# Patient Record
Sex: Female | Born: 1990 | Race: Black or African American | Hispanic: No | Marital: Single | Smoking: Former smoker
Health system: Southern US, Community
[De-identification: ages and names within clinical notes are randomized; demographics above are authoritative.]

## PROBLEM LIST (undated history)

## (undated) DIAGNOSIS — J45909 Unspecified asthma, uncomplicated: Secondary | ICD-10-CM

---

## 2018-08-21 ENCOUNTER — Other Ambulatory Visit: Payer: Self-pay

## 2018-08-21 ENCOUNTER — Emergency Department (HOSPITAL_BASED_OUTPATIENT_CLINIC_OR_DEPARTMENT_OTHER)
Admission: EM | Admit: 2018-08-21 | Discharge: 2018-08-21 | Disposition: A | Discharge status: Home / Self Care | Payer: Medicaid Other | Attending: Emergency Medicine | Admitting: Emergency Medicine

## 2018-08-21 ENCOUNTER — Encounter (HOSPITAL_BASED_OUTPATIENT_CLINIC_OR_DEPARTMENT_OTHER): Payer: Self-pay | Admitting: *Deleted

## 2018-08-21 ENCOUNTER — Emergency Department (HOSPITAL_BASED_OUTPATIENT_CLINIC_OR_DEPARTMENT_OTHER): Payer: Medicaid Other

## 2018-08-21 DIAGNOSIS — O9989 Other specified diseases and conditions complicating pregnancy, childbirth and the puerperium: Secondary | ICD-10-CM | POA: Diagnosis present

## 2018-08-21 DIAGNOSIS — R1084 Generalized abdominal pain: Secondary | ICD-10-CM | POA: Diagnosis not present

## 2018-08-21 DIAGNOSIS — O283 Abnormal ultrasonic finding on antenatal screening of mother: Secondary | ICD-10-CM | POA: Diagnosis not present

## 2018-08-21 DIAGNOSIS — Z3201 Encounter for pregnancy test, result positive: Secondary | ICD-10-CM | POA: Diagnosis not present

## 2018-08-21 DIAGNOSIS — Z79899 Other long term (current) drug therapy: Secondary | ICD-10-CM | POA: Insufficient documentation

## 2018-08-21 DIAGNOSIS — Z87891 Personal history of nicotine dependence: Secondary | ICD-10-CM | POA: Insufficient documentation

## 2018-08-21 DIAGNOSIS — M545 Low back pain: Secondary | ICD-10-CM | POA: Diagnosis not present

## 2018-08-21 DIAGNOSIS — Z3A00 Weeks of gestation of pregnancy not specified: Secondary | ICD-10-CM | POA: Diagnosis not present

## 2018-08-21 DIAGNOSIS — O26899 Other specified pregnancy related conditions, unspecified trimester: Secondary | ICD-10-CM | POA: Insufficient documentation

## 2018-08-21 DIAGNOSIS — O3680X0 Pregnancy with inconclusive fetal viability, not applicable or unspecified: Secondary | ICD-10-CM

## 2018-08-21 DIAGNOSIS — O23599 Infection of other part of genital tract in pregnancy, unspecified trimester: Secondary | ICD-10-CM | POA: Diagnosis not present

## 2018-08-21 DIAGNOSIS — R110 Nausea: Secondary | ICD-10-CM | POA: Insufficient documentation

## 2018-08-21 DIAGNOSIS — R109 Unspecified abdominal pain: Secondary | ICD-10-CM

## 2018-08-21 LAB — CBC WITH DIFFERENTIAL/PLATELET
Basophils Absolute: 0 10*3/uL (ref 0.0–0.1)
Basophils Relative: 0 %
Eosinophils Absolute: 0.3 10*3/uL (ref 0.0–0.7)
Eosinophils Relative: 3 %
HCT: 34.6 % — ABNORMAL LOW (ref 36.0–46.0)
Hemoglobin: 11.2 g/dL — ABNORMAL LOW (ref 12.0–15.0)
Lymphocytes Relative: 19 %
Lymphs Abs: 2.2 10*3/uL (ref 0.7–4.0)
MCH: 26.2 pg (ref 26.0–34.0)
MCHC: 32.4 g/dL (ref 30.0–36.0)
MCV: 80.8 fL (ref 78.0–100.0)
Monocytes Absolute: 0.6 10*3/uL (ref 0.1–1.0)
Monocytes Relative: 5 %
Neutro Abs: 8.7 10*3/uL — ABNORMAL HIGH (ref 1.7–7.7)
Neutrophils Relative %: 73 %
Platelets: 271 10*3/uL (ref 150–400)
RBC: 4.28 MIL/uL (ref 3.87–5.11)
RDW: 16.7 % — ABNORMAL HIGH (ref 11.5–15.5)
WBC: 11.9 10*3/uL — ABNORMAL HIGH (ref 4.0–10.5)

## 2018-08-21 LAB — WET PREP, GENITAL
Clue Cells Wet Prep HPF POC: NONE SEEN
Sperm: NONE SEEN
Trich, Wet Prep: NONE SEEN
Yeast Wet Prep HPF POC: NONE SEEN

## 2018-08-21 LAB — COMPREHENSIVE METABOLIC PANEL
ALT: 14 U/L (ref 0–44)
AST: 16 U/L (ref 15–41)
Albumin: 3 g/dL — ABNORMAL LOW (ref 3.5–5.0)
Alkaline Phosphatase: 67 U/L (ref 38–126)
Anion gap: 9 (ref 5–15)
BUN: 9 mg/dL (ref 6–20)
CO2: 25 mmol/L (ref 22–32)
Calcium: 9 mg/dL (ref 8.9–10.3)
Chloride: 108 mmol/L (ref 98–111)
Creatinine, Ser: 0.71 mg/dL (ref 0.44–1.00)
GFR calc Af Amer: >60 mL/min (ref >60)
GFR calc non Af Amer: >60 mL/min (ref >60)
Glucose, Bld: 105 mg/dL — ABNORMAL HIGH (ref 70–99)
Potassium: 4.1 mmol/L (ref 3.5–5.1)
Sodium: 142 mmol/L (ref 135–145)
Total Bilirubin: 0.4 mg/dL (ref 0.3–1.2)
Total Protein: 7.8 g/dL (ref 6.5–8.1)

## 2018-08-21 LAB — URINALYSIS, ROUTINE W REFLEX MICROSCOPIC
BILIRUBIN URINE: NEGATIVE
GLUCOSE, UA: NEGATIVE mg/dL
HGB URINE DIPSTICK: NEGATIVE
KETONES UR: NEGATIVE mg/dL
LEUKOCYTES UA: NEGATIVE
Nitrite: NEGATIVE
PH: 5.5 (ref 5.0–8.0)
PROTEIN: NEGATIVE mg/dL
Specific Gravity, Urine: >1.03 — ABNORMAL HIGH (ref 1.005–1.030)

## 2018-08-21 LAB — PREGNANCY, URINE: Preg Test, Ur: POSITIVE — AB

## 2018-08-21 LAB — HCG, QUANTITATIVE, PREGNANCY: hCG, Beta Chain, Quant, S: 362 m[IU]/mL — ABNORMAL HIGH (ref <5)

## 2018-08-21 MED ORDER — AZITHROMYCIN 250 MG PO TABS
1000.0000 mg | ORAL_TABLET | Freq: Once | ORAL | Status: AC
  Administered 2018-08-21: 1000 mg via ORAL
  Filled 2018-08-21: qty 4

## 2018-08-21 MED ORDER — CEFTRIAXONE SODIUM 250 MG IJ SOLR
250.0000 mg | Freq: Once | INTRAMUSCULAR | Status: AC
  Administered 2018-08-21: 250 mg via INTRAMUSCULAR
  Filled 2018-08-21: qty 250

## 2018-08-21 MED ORDER — PRENATAL COMPLETE 14-0.4 MG PO TABS: 1.0000 | ORAL_TABLET | Freq: Every day | ORAL | 0 refills | Status: DC

## 2018-08-21 NOTE — ED Provider Notes (Signed)
[facility] EMERGENCY DEPARTMENT Provider Note   CSN: [id number] Arrival date & time: 08/21/18  1027     History   Chief Complaint Chief Complaint  Patient presents with  . Abdominal Cramping    HPI Katherine Padilla is a 27 y.o. female with a one-week history of intermittent lower abdominal cramping that feels like a menstrual cramp.  Patient reports she has had some vaginal discharge, but no vaginal bleeding.  She took a pregnancy test at home 2 days ago that was positive.  She reports her last menstrual cycle was sometime in July.  She has had some associated nausea, but no vomiting.  She denies any other abdominal pain, chest pain, shortness of breath, urinary symptoms.  She has had occasional low back pain in association with her cramping.  No medications taken prior to arrival.  She has not seen OB/GYN yet.  HPI  History reviewed. No pertinent past medical history.  There are no active problems to display for this patient.   History reviewed. No pertinent surgical history.   OB History   None      Home Medications    Prior to Admission medications   Medication Sig Start Date End Date Taking? Authorizing Provider  Prenatal Vit-Fe Fumarate-FA (PRENATAL COMPLETE) 14-0.4 MG TABS Take 1 tablet by mouth daily. 08/21/18   Emi Holes, PA-C    Family History History reviewed. No pertinent family history.  Social History Social History   Tobacco Use  . Smoking status: Former Games developer  . Smokeless tobacco: Never Used  Substance Use Topics  . Alcohol use: Not Currently  . Drug use: Not Currently     Allergies   Patient has no known allergies.   Review of Systems Review of Systems  Constitutional: Negative for chills and fever.  HENT: Negative for facial swelling and sore throat.   Respiratory: Negative for shortness of breath.   Cardiovascular: Negative for chest pain.  Gastrointestinal: Positive for abdominal pain (lower abdominal cramping) and  nausea. Negative for vomiting.  Genitourinary: Positive for pelvic pain and vaginal discharge. Negative for dysuria and vaginal bleeding.  Musculoskeletal: Positive for back pain.  Skin: Negative for rash and wound.  Neurological: Negative for headaches.  Psychiatric/Behavioral: The patient is not nervous/anxious.      Physical Exam Updated Vital Signs BP 132/63 (BP Location: Left Arm)   Pulse 96   Temp 98.5 F (36.9 C) (Oral)   Resp 16   Ht 5\' 1"  (1.549 m)   Wt 116.1 kg   LMP 06/21/2018   SpO2 100%   BMI 48.37 kg/m   Physical Exam  Constitutional: She appears well-developed and well-nourished. No distress.  HENT:  Head: Normocephalic and atraumatic.  Mouth/Throat: Oropharynx is clear and moist. No oropharyngeal exudate.  Eyes: Pupils are equal, round, and reactive to light. Conjunctivae are normal. Right eye exhibits no discharge. Left eye exhibits no discharge. No scleral icterus.  Neck: Normal range of motion. Neck supple. No thyromegaly present.  Cardiovascular: Normal rate, regular rhythm, normal heart sounds and intact distal pulses. Exam reveals no gallop and no friction rub.  No murmur heard. Pulmonary/Chest: Effort normal and breath sounds normal. No stridor. No respiratory distress. She has no wheezes. She has no rales.  Abdominal: Soft. Bowel sounds are normal. She exhibits no distension. There is no tenderness. There is no rebound and no guarding.  Genitourinary: Uterus normal. Pelvic exam was performed with patient supine. Cervix exhibits no motion tenderness. Right adnexum displays no  tenderness. Left adnexum displays no tenderness. Vaginal discharge (white. milky) found.  Genitourinary Comments: Chaperone present  Musculoskeletal: She exhibits no edema.  Lymphadenopathy:    She has no cervical adenopathy.  Neurological: She is alert. Coordination normal.  Skin: Skin is warm and dry. No rash noted. She is not diaphoretic. No pallor.  Psychiatric: She has a  normal mood and affect.  Nursing note and vitals reviewed.    ED Treatments / Results  Labs (all labs ordered are listed, but only abnormal results are displayed) Labs Reviewed  WET PREP, GENITAL - Abnormal; Notable for the following components:      Result Value   WBC, Wet Prep HPF POC MODERATE (*)    All other components within normal limits  URINALYSIS, ROUTINE W REFLEX MICROSCOPIC - Abnormal; Notable for the following components:   Specific Gravity, Urine >1.030 (*)    All other components within normal limits  PREGNANCY, URINE - Abnormal; Notable for the following components:   Preg Test, Ur POSITIVE (*)    All other components within normal limits  COMPREHENSIVE METABOLIC PANEL - Abnormal; Notable for the following components:   Glucose, Bld 105 (*)    Albumin 3.0 (*)    All other components within normal limits  CBC WITH DIFFERENTIAL/PLATELET - Abnormal; Notable for the following components:   WBC 11.9 (*)    Hemoglobin 11.2 (*)    HCT 34.6 (*)    RDW 16.7 (*)    Neutro Abs 8.7 (*)    All other components within normal limits  HCG, QUANTITATIVE, PREGNANCY - Abnormal; Notable for the following components:   hCG, Beta Chain, Quant, S 362 (*)    All other components within normal limits  GC/CHLAMYDIA PROBE AMP ([place]) NOT AT [facility]    EKG None  Radiology US Ob Comp < 14 Wks  Result Date: 08/21/2018 CLINICAL DATA:  Pelvic cramping for the past 2 days. Positive pregnancy test. Patient unsure of LMP. EXAM: OBSTETRIC <14 WK [place] AND TRANSVAGINAL OB US TECHNIQUE: Both transabdominal and transvaginal ultrasound examinations were performed for complete evaluation of the gestation as well as the maternal uterus, adnexal regions, and pelvic cul-de-sac. Transvaginal technique was performed to assess early pregnancy. COMPARISON:  None. FINDINGS: Intrauterine gestational sac: None Yolk sac:  Not Visualized. Embryo:  Not Visualized. Maternal uterus/adnexae: Unremarkable.  Right  corpus luteum. Trace free fluid in the pelvis, likely physiologic. IMPRESSION: No IUP is visualized. By definition, in the setting of a positive pregnancy test, this reflects a pregnancy of unknown location. Differential considerations include early normal IUP, abnormal IUP/missed abortion, or nonvisualized ectopic pregnancy. Serial beta HCG is suggested. Consider repeat pelvic ultrasound in 14 days. Electronically Signed   By: Obie Dredge M.D.   On: 08/21/2018 14:06   US Ob Transvaginal  Result Date: 08/21/2018 CLINICAL DATA:  Pelvic cramping for the past 2 days. Positive pregnancy test. Patient unsure of LMP. EXAM: OBSTETRIC <14 WK [place] AND TRANSVAGINAL OB US TECHNIQUE: Both transabdominal and transvaginal ultrasound examinations were performed for complete evaluation of the gestation as well as the maternal uterus, adnexal regions, and pelvic cul-de-sac. Transvaginal technique was performed to assess early pregnancy. COMPARISON:  None. FINDINGS: Intrauterine gestational sac: None Yolk sac:  Not Visualized. Embryo:  Not Visualized. Maternal uterus/adnexae: Unremarkable.  Right corpus luteum. Trace free fluid in the pelvis, likely physiologic. IMPRESSION: No IUP is visualized. By definition, in the setting of a positive pregnancy test, this reflects a pregnancy of unknown location. Differential considerations  include early normal IUP, abnormal IUP/missed abortion, or nonvisualized ectopic pregnancy. Serial beta HCG is suggested. Consider repeat pelvic ultrasound in 14 days. Electronically Signed   By: Obie DredgeWilliam T Derry M.D.   On: 08/21/2018 14:06    Procedures Procedures (including critical care time)  Medications Ordered in ED Medications  cefTRIAXone (ROCEPHIN) injection 250 mg (250 mg Intramuscular Given 08/21/18 1243)  azithromycin (ZITHROMAX) tablet 1,000 mg (1,000 mg Oral Given 08/21/18 1244)     Initial Impression / Assessment and Plan / ED Course  I have reviewed the triage vital signs and  the nursing notes.  Pertinent labs & imaging results that were available during my care of the patient were reviewed by me and considered in my medical decision making (see chart for details).     Patient with lower abdominal cramping in the setting of positive pregnancy test.  hCG 362.  CBC shows WBC 11.9, hemoglobin 11.2.  Patient has history of anemia.  UA is negative.  Urine pregnancy negative.  CMP shows glucose 105, albumin 3.0.  Wet prep shows moderate WBCs.  GC/chlamydia sent and pending, however concern for STD exposure so Rocephin and azithromycin given in the ED.  Pelvic ultrasound shows no IUP visualized and reflects of pregnancy of unknown location.  Possibly too early to tell considering most likely patient is less than [redacted] weeks pregnant.  Radiologist recommends serial beta-hCG and repeat ultrasound in 14 days.  Patient advised [facility] days for repeat hCG and to establish care.  Prenatal vitamins initiated.  Strict return precautions given.  Patient understands and agrees with plan.  Patient vitals stable throughout ED course and discharged in satisfactory condition. I discussed patient case with Dr. Lynelle DoctorKnapp who guided the patient's management and agrees with plan.   Final Clinical Impressions(s) / ED Diagnoses   Final diagnoses:  Pregnancy of unknown anatomic location  Abdominal cramping    ED Discharge Orders         Ordered    Prenatal Vit-Fe Fumarate-FA (PRENATAL COMPLETE) 14-0.4 MG TABS  Daily     08/21/18 1436           Emi HolesLaw, Camyah Pultz M, PA-C 08/21/18 1443    Linwood DibblesKnapp, Jon, MD 08/22/18 0830

## 2018-08-21 NOTE — ED Notes (Signed)
Patient transported to Ultrasound 

## 2018-08-21 NOTE — ED Triage Notes (Signed)
Co/ lower pelvic cramping positive preg test at home LMP 7/1

## 2018-08-21 NOTE — Discharge Instructions (Addendum)
Your ultrasound did not show an intrauterine pregnancy.  This could be that it is too early to see or that you have an ectopic (tubal pregnancy) or miscarriage.  Begin taking prenatal vitamins as we discussed.  You can take the ones I have prescribed or if the once you talked about, as long as it has folic acid.  [facility] for repeat hCG in 2 days.  Please have a repeat ultrasound completed on 14 days.  Please follow-up with OB/GYN for further evaluation and prenatal care.  Please return to emergency department or [facility] if you develop any new or worsening symptoms.

## 2018-08-24 ENCOUNTER — Encounter (HOSPITAL_BASED_OUTPATIENT_CLINIC_OR_DEPARTMENT_OTHER): Payer: Self-pay

## 2018-08-24 ENCOUNTER — Emergency Department (HOSPITAL_BASED_OUTPATIENT_CLINIC_OR_DEPARTMENT_OTHER)
Admission: EM | Admit: 2018-08-24 | Discharge: 2018-08-24 | Disposition: A | Discharge status: Home / Self Care | Payer: Medicaid Other | Attending: Emergency Medicine | Admitting: Emergency Medicine

## 2018-08-24 ENCOUNTER — Other Ambulatory Visit: Payer: Self-pay

## 2018-08-24 DIAGNOSIS — R102 Pelvic and perineal pain: Secondary | ICD-10-CM | POA: Diagnosis not present

## 2018-08-24 DIAGNOSIS — O26891 Other specified pregnancy related conditions, first trimester: Secondary | ICD-10-CM | POA: Insufficient documentation

## 2018-08-24 DIAGNOSIS — Z87891 Personal history of nicotine dependence: Secondary | ICD-10-CM | POA: Insufficient documentation

## 2018-08-24 DIAGNOSIS — Z3A01 Less than 8 weeks gestation of pregnancy: Secondary | ICD-10-CM | POA: Diagnosis not present

## 2018-08-24 LAB — GC/CHLAMYDIA PROBE AMP (~~LOC~~) NOT AT ARMC
Chlamydia: NEGATIVE
Neisseria Gonorrhea: NEGATIVE

## 2018-08-24 LAB — HCG, QUANTITATIVE, PREGNANCY: hCG, Beta Chain, Quant, S: 1221 m[IU]/mL — ABNORMAL HIGH (ref <5)

## 2018-08-24 MED ORDER — ACETAMINOPHEN 325 MG PO TABS
650.0000 mg | ORAL_TABLET | Freq: Once | ORAL | Status: AC
  Administered 2018-08-24: 650 mg via ORAL
  Filled 2018-08-24: qty 2

## 2018-08-24 NOTE — ED Triage Notes (Signed)
Pt states she is still having abd cramps-was advised to fu with [facility] but is too far to drive-states sh was advised to return for recheck of "hormones"-NAD-steady gait

## 2018-08-24 NOTE — ED Provider Notes (Signed)
[facility] EMERGENCY DEPARTMENT Provider Note   CSN: [id number] Arrival date & time: 08/24/18  1606     History   Chief Complaint Chief Complaint  Patient presents with  . Abdominal Pain    HPI Katherine Padilla is a 27 y.o. female presenting for TG recheck and lower abdominal cramping.  Patient states she was seen in the ER 3 days ago for lower abdominal cramping.  The day before, she had taken a home pregnancy test which was positive.  While in the ER, she had a pelvic exam, pelvic ultrasound, and lab work which showed early pregnancy.  No iup seen on ultrasound, likely due to being too early.  Today, patient reports cramping has persisted.  It has not changed at all since she was evaluated last time.  No worsening of the cramping.  It is intermittent, lasts for about 15 minutes before resolving without intervention.  She has not tried anything for this including heating pads or Tylenol.  She denies vaginal bleeding or vaginal discharge.  Denies fevers, chills, chest pain, shortness breath, nausea, vomiting, or abdominal pain.  She denies association with urination or bowel movements.  Patient states she had similar abdominal cramping during her previous pregnancy.  She has not been able to follow-up with OB/GYN yet.  She decided to come here for recheck of her hCG, she is still working on her Medicaid for follow-up with OB/GYN.  States she is taking prenatal vitamins, she has no other medical problems, takes no medications daily.  HPI  History reviewed. No pertinent past medical history.  There are no active problems to display for this patient.   History reviewed. No pertinent surgical history.   OB History    Gravida  1   Para      Term      Preterm      AB      Living        SAB      TAB      Ectopic      Multiple      Live Births               Home Medications    Prior to Admission medications   Medication Sig Start Date End Date Taking?  Authorizing Provider  Prenatal Vit-Fe Fumarate-FA (PRENATAL COMPLETE) 14-0.4 MG TABS Take 1 tablet by mouth daily. 08/21/18   Emi Holes, PA-C    Family History No family history on file.  Social History Social History   Tobacco Use  . Smoking status: Former Games developer  . Smokeless tobacco: Never Used  Substance Use Topics  . Alcohol use: Not Currently  . Drug use: Not Currently     Allergies   Patient has no known allergies.   Review of Systems Review of Systems  Genitourinary: Positive for pelvic pain (intermittent).  All other systems reviewed and are negative.    Physical Exam Updated Vital Signs BP 114/86   Pulse (!) 103   Temp 98.8 F (37.1 C) (Oral)   Resp 18   Ht 5\' 1"  (1.549 m)   Wt 116.6 kg   LMP 06/21/2018   SpO2 100%   BMI 48.56 kg/m   Physical Exam  Constitutional: She is oriented to person, place, and time. She appears well-developed and well-nourished. No distress.  Sitting comfortably in the bed in NAD  HENT:  Head: Normocephalic and atraumatic.  Eyes: Pupils are equal, round, and reactive to light. Conjunctivae and EOM  are normal.  Neck: Normal range of motion. Neck supple.  Cardiovascular: Regular rhythm and intact distal pulses.  Mildly tachycardic around 100  Pulmonary/Chest: Effort normal and breath sounds normal. No respiratory distress. She has no wheezes.  Abdominal: Soft. She exhibits no distension and no mass. There is tenderness. There is no rebound and no guarding.  Pt reports TTP of suprapubic abd without change in expression. No guarding, rigidity, or rebound. No TTP of the bilateral lower abd.   Musculoskeletal: Normal range of motion.  Neurological: She is alert and oriented to person, place, and time.  Skin: Skin is warm and dry.  Psychiatric: She has a normal mood and affect.  Nursing note and vitals reviewed.    ED Treatments / Results  Labs (all labs ordered are listed, but only abnormal results are  displayed) Labs Reviewed  HCG, QUANTITATIVE, PREGNANCY - Abnormal; Notable for the following components:      Result Value   hCG, Beta Chain, Quant, S 1,221 (*)    All other components within normal limits    EKG None  Radiology No results found.  Procedures Procedures (including critical care time)  Medications Ordered in ED Medications  acetaminophen (TYLENOL) tablet 650 mg (650 mg Oral Given 08/24/18 1727)     Initial Impression / Assessment and Plan / ED Course  I have reviewed the triage vital signs and the nursing notes.  Pertinent labs & imaging results that were available during my care of the patient were reviewed by me and considered in my medical decision making (see chart for details).     Pt presenting for evaluation of lower abdominal cramping and for recheck of her hCG.  Physical exam reassuring, she appears nontoxic.  No change in expression with palpation of the abdomen, though patient reports mild pain with suprapubic palpation.  No tenderness to palpation of bilateral lower abdomen.  Patient denies vaginal discharge or vaginal bleeding.  Cramping has not changed since her full evaluation 3 days ago.  It has not worsened at all.  Additionally, patient reports she had similar cramping during her pregnancy last time.  Discussed with patient.  Discussed the ultrasound will likely not show anything different from previous.  Will obtain hCG for trending.  Tylenol given for pain.  hCG increasing as expected.  On reassessment, patient reports pain is improved.  Heart rate has improved to 94.  Heart rate likely pain related, and when discussed with patient, she states she is very excited about her upcoming pregnancy and thinks this may be contributing.  She does not want a repeat pelvic or ultrasound today.  Discussed with patient importance of follow-up with OB/GYN.  Discussed use of heating pads for mild to moderate pain, Tylenol as needed for severe pain.  Discussed  importance of follow-up if she has vaginal bleeding, severe/worsening abdominal cramping, or any new or concerning symptoms.  At this time, patient appears safe for discharge.  Patient states she understands and agrees to plan.   Final Clinical Impressions(s) / ED Diagnoses   Final diagnoses:  Less than [redacted] weeks gestation of pregnancy    ED Discharge Orders    None       Alveria Apley, PA-C 08/24/18 1849    Vanetta Mulders, MD 08/31/18 [phone]

## 2018-08-24 NOTE — Discharge Instructions (Addendum)
Use heating pads to help with abdominal cramping. If pain is severe, you may take Tylenol. Make sure you are staying well-hydrated with water. It is important that you contact OB/GYN to set up an appointment for further management of your pregnancy. Return to the emergency room or follow-up at the [facility] hospital if you are having severe abdominal cramping, vaginal bleeding, or any new or worsening symptoms.

## 2018-09-05 ENCOUNTER — Encounter (HOSPITAL_BASED_OUTPATIENT_CLINIC_OR_DEPARTMENT_OTHER): Payer: Self-pay

## 2018-09-05 ENCOUNTER — Emergency Department (HOSPITAL_BASED_OUTPATIENT_CLINIC_OR_DEPARTMENT_OTHER)
Admission: EM | Admit: 2018-09-05 | Discharge: 2018-09-05 | Disposition: A | Discharge status: Home / Self Care | Payer: Medicaid Other | Attending: Emergency Medicine | Admitting: Emergency Medicine

## 2018-09-05 ENCOUNTER — Other Ambulatory Visit: Payer: Self-pay

## 2018-09-05 ENCOUNTER — Emergency Department (HOSPITAL_BASED_OUTPATIENT_CLINIC_OR_DEPARTMENT_OTHER): Payer: Medicaid Other

## 2018-09-05 DIAGNOSIS — Z3A01 Less than 8 weeks gestation of pregnancy: Secondary | ICD-10-CM | POA: Insufficient documentation

## 2018-09-05 DIAGNOSIS — Z87891 Personal history of nicotine dependence: Secondary | ICD-10-CM | POA: Insufficient documentation

## 2018-09-05 DIAGNOSIS — O209 Hemorrhage in early pregnancy, unspecified: Secondary | ICD-10-CM | POA: Insufficient documentation

## 2018-09-05 LAB — URINALYSIS, ROUTINE W REFLEX MICROSCOPIC
Bilirubin Urine: NEGATIVE
Glucose, UA: NEGATIVE mg/dL
Hgb urine dipstick: NEGATIVE
Ketones, ur: NEGATIVE mg/dL
Leukocytes, UA: NEGATIVE
NITRITE: NEGATIVE
PH: 6.5 (ref 5.0–8.0)
Protein, ur: NEGATIVE mg/dL
SPECIFIC GRAVITY, URINE: 1.01 (ref 1.005–1.030)

## 2018-09-05 LAB — PREGNANCY, URINE: Preg Test, Ur: POSITIVE — AB

## 2018-09-05 NOTE — ED Triage Notes (Signed)
Pt states she is 4-[redacted] weeks pregnant. Pt went to restroom this AM and passed a blood clot.

## 2018-09-05 NOTE — Discharge Instructions (Addendum)
Call you ob for follow up  [facility] [place] or worsening symptoms

## 2018-09-05 NOTE — ED Notes (Signed)
Pt understood dc material. NAD noted. 

## 2018-09-05 NOTE — ED Provider Notes (Signed)
[facility] EMERGENCY DEPARTMENT Provider Note   CSN: [id number] Arrival date & time: 09/05/18  1422     History   Chief Complaint Chief Complaint  Patient presents with  . Vaginal Bleeding    HPI Katherine Padilla is a 27 y.o. female.  HPI Patient is a 27 year old female presents the emergency department with mild lower abdominal discomfort and a small amount of vaginal spotting noted earlier today.  No ongoing vaginal bleeding.  She reports positive pregnancy test x2 at home.  She believes she is 4 to [redacted] weeks pregnant based on her last normal menstrual cycle.  She denies lightheadedness.  No upper abdominal pain.  No back pain or flank pain.  No new vaginal complaints otherwise.  This would make the patient a G2, P1 A0   History reviewed. No pertinent past medical history.  There are no active problems to display for this patient.   History reviewed. No pertinent surgical history.   OB History    Gravida  1   Para      Term      Preterm      AB      Living        SAB      TAB      Ectopic      Multiple      Live Births               Home Medications    Prior to Admission medications   Medication Sig Start Date End Date Taking? Authorizing Provider  Prenatal Vit-Fe Fumarate-FA (PRENATAL COMPLETE) 14-0.4 MG TABS Take 1 tablet by mouth daily. 08/21/18   Emi Holes, PA-C    Family History No family history on file.  Social History Social History   Tobacco Use  . Smoking status: Former Games developer  . Smokeless tobacco: Never Used  Substance Use Topics  . Alcohol use: Not Currently  . Drug use: Not Currently     Allergies   Patient has no known allergies.   Review of Systems Review of Systems  All other systems reviewed and are negative.    Physical Exam Updated Vital Signs BP (!) 146/77 (BP Location: Left Arm)   Pulse 100   Temp 98.8 F (37.1 C) (Oral)   Resp 18   Ht 5\' 1"  (1.549 m)   Wt 116.6 kg   LMP 07/22/2018    SpO2 100%   BMI 48.56 kg/m   Physical Exam  Constitutional: She is oriented to person, place, and time. She appears well-developed and well-nourished.  HENT:  Head: Normocephalic.  Eyes: EOM are normal.  Neck: Normal range of motion.  Cardiovascular: Normal rate.  Pulmonary/Chest: Effort normal and breath sounds normal.  Abdominal: Soft. She exhibits no distension. There is no tenderness.  Musculoskeletal: Normal range of motion.  Neurological: She is alert and oriented to person, place, and time.  Psychiatric: She has a normal mood and affect.  Nursing note and vitals reviewed.    ED Treatments / Results  Labs (all labs ordered are listed, but only abnormal results are displayed) Labs Reviewed  PREGNANCY, URINE - Abnormal; Notable for the following components:      Result Value   Preg Test, Ur POSITIVE (*)    All other components within normal limits  URINALYSIS, ROUTINE W REFLEX MICROSCOPIC    EKG None  Radiology US Ob Comp < 14 Wks  Result Date: 09/05/2018 CLINICAL DATA:  Pregnant patient passing small blood  clot. EXAM: OBSTETRIC <14 WK US AND TRANSVAGINAL OB US TECHNIQUE: Both transabdominal and transvaginal ultrasound examinations were performed for complete evaluation of the gestation as well as the maternal uterus, adnexal regions, and pelvic cul-de-sac. Transvaginal technique was performed to assess early pregnancy. COMPARISON:  Pelvic ultrasound 08/21/2018 FINDINGS: Intrauterine gestational sac: Single Yolk sac:  Visualized. Embryo:  Visualized. Cardiac Activity: Visualized. Heart Rate: 122 bpm CRL:  6.9 mm   6 w   4 d                  US EDC: 04/27/2019 Subchorionic hemorrhage:  Small Maternal uterus/adnexae: Normal right and left ovaries. Corpus luteum right ovary. Trace fluid in the pelvis. IMPRESSION: Single live intrauterine gestation.  Small subchorionic hemorrhage. Electronically Signed   By: Annia Beltrew  Davis M.D.   On: 09/05/2018 16:10   [place] Ob  Transvaginal  Result Date: 09/05/2018 CLINICAL DATA:  Pregnant patient passing small blood clot. EXAM: OBSTETRIC <14 WK US AND TRANSVAGINAL OB US TECHNIQUE: Both transabdominal and transvaginal ultrasound examinations were performed for complete evaluation of the gestation as well as the maternal uterus, adnexal regions, and pelvic cul-de-sac. Transvaginal technique was performed to assess early pregnancy. COMPARISON:  Pelvic ultrasound 08/21/2018 FINDINGS: Intrauterine gestational sac: Single Yolk sac:  Visualized. Embryo:  Visualized. Cardiac Activity: Visualized. Heart Rate: 122 bpm CRL:  6.9 mm   6 w   4 d                  US EDC: 04/27/2019 Subchorionic hemorrhage:  Small Maternal uterus/adnexae: Normal right and left ovaries. Corpus luteum right ovary. Trace fluid in the pelvis. IMPRESSION: Single live intrauterine gestation.  Small subchorionic hemorrhage. Electronically Signed   By: Annia Beltrew  Davis M.D.   On: 09/05/2018 16:10    Procedures Procedures (including critical care time)  Medications Ordered in ED Medications - No data to display   Initial Impression / Assessment and Plan / ED Course  I have reviewed the triage vital signs and the nursing notes.  Pertinent labs & imaging results that were available during my care of the patient were reviewed by me and considered in my medical decision making (see chart for details).    Intrauterine pregnancy noted on ultrasound.  First trimester bleeding.  Small subchorionic hemorrhage.  No free fluid in her abdomen.  Vitals are stable.  Well-appearing.  Follow-up with her obstetrician.  Return precautions given.  Final Clinical Impressions(s) / ED Diagnoses   Final diagnoses:  None    ED Discharge Orders    None       Azalia Bilisampos, Jeziel Hoffmann, MD 09/05/18 1623

## 2019-04-21 IMAGING — US US OB TRANSVAGINAL
1 series · 14 of 28 positions shown · non-contrast
Comparison: None.

CLINICAL DATA: Pelvic cramping for the past 2 days. Positive
pregnancy test. Patient unsure of LMP.

EXAM:
OBSTETRIC <14 WK US AND TRANSVAGINAL OB US
TECHNIQUE: Both transabdominal and transvaginal ultrasound examinations were
performed for complete evaluation of the gestation as well as the
maternal uterus, adnexal regions, and pelvic cul-de-sac.
Transvaginal technique was performed to assess early pregnancy.

[Series 1: us ob transvaginal · 0.24mm/px · 14 of 115 slices shown]
[im 5/115]
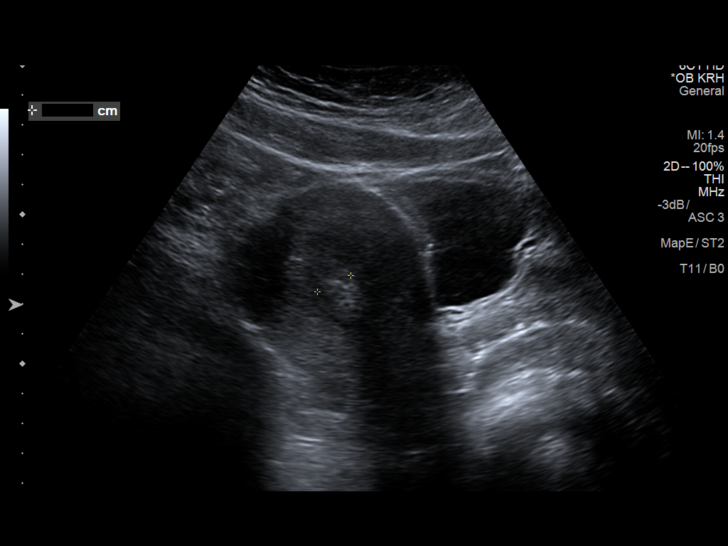
[im 13/115]
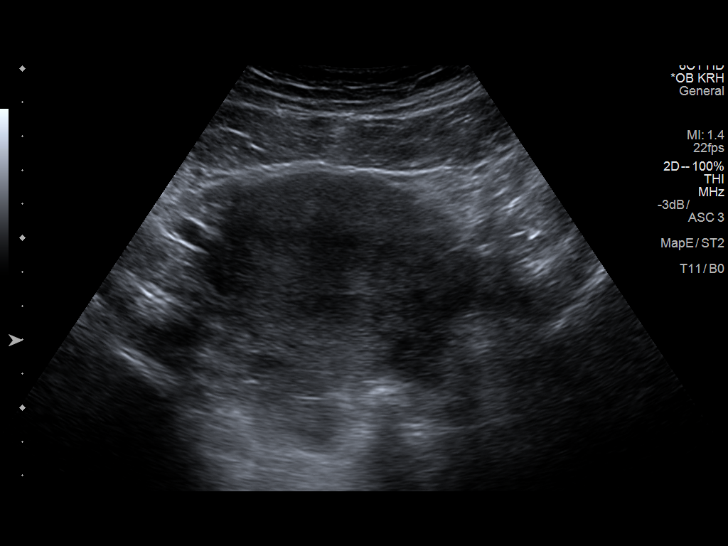
[im 22/115]
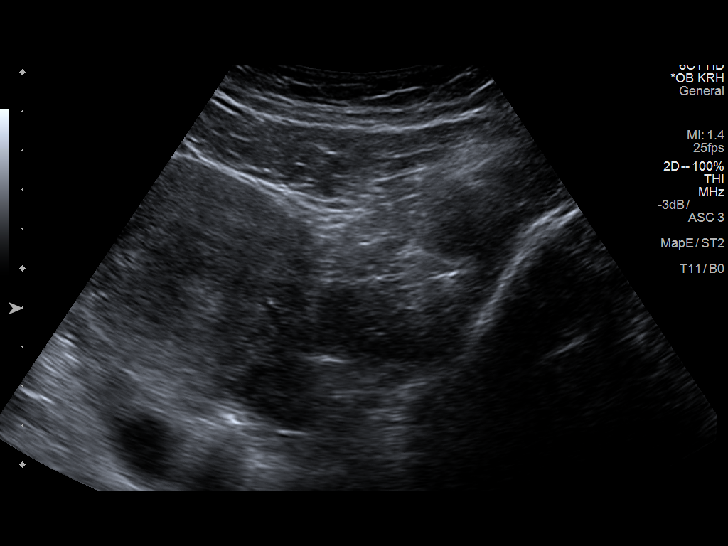
[im 30/115]
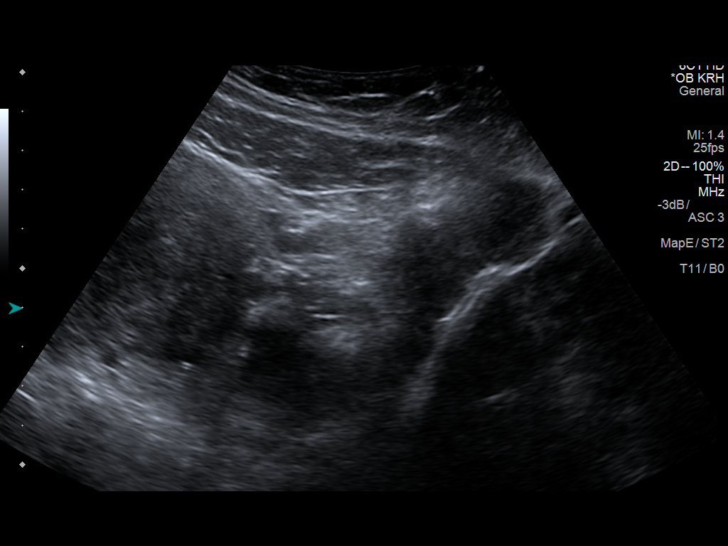
[im 39/115]
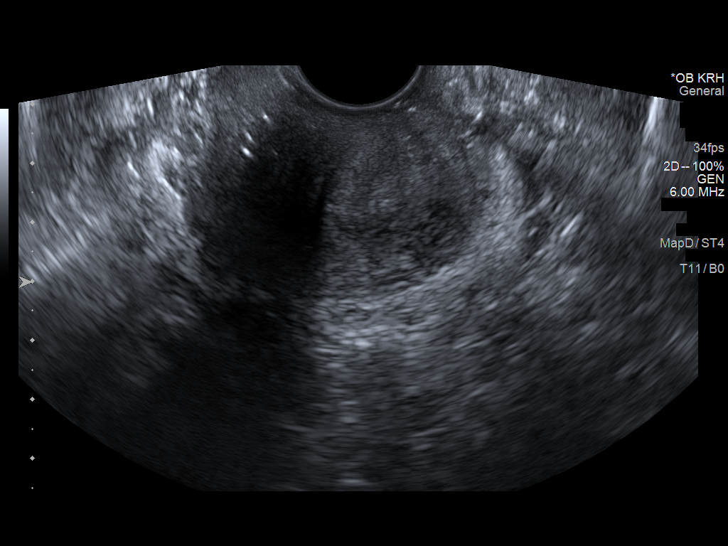
[im 47/115]
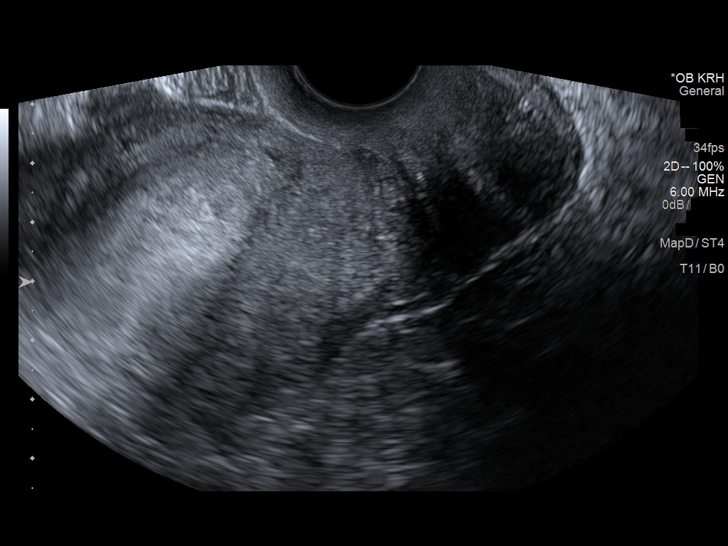
[im 55/115]
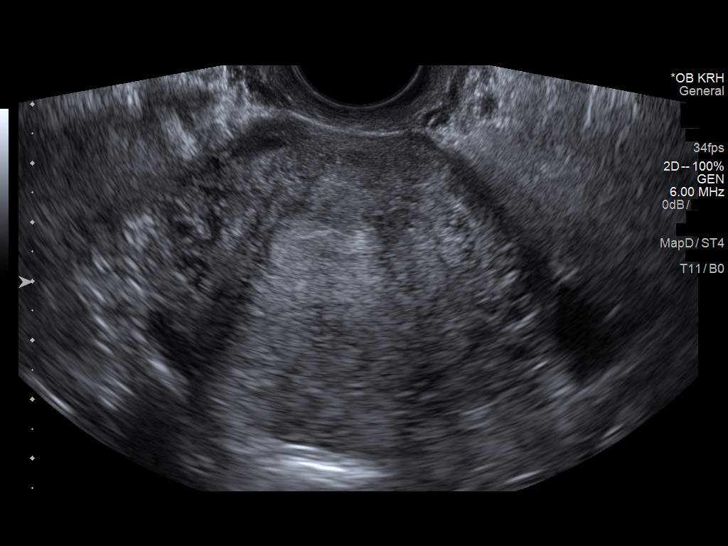
[im 64/115]
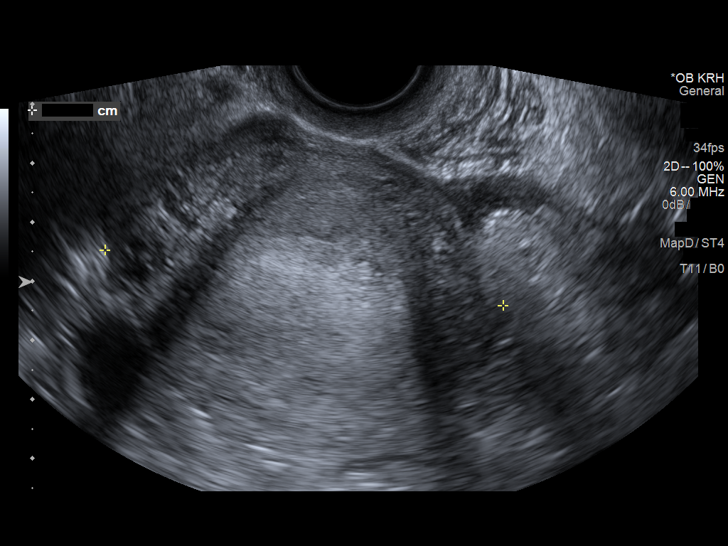
[im 72/115]
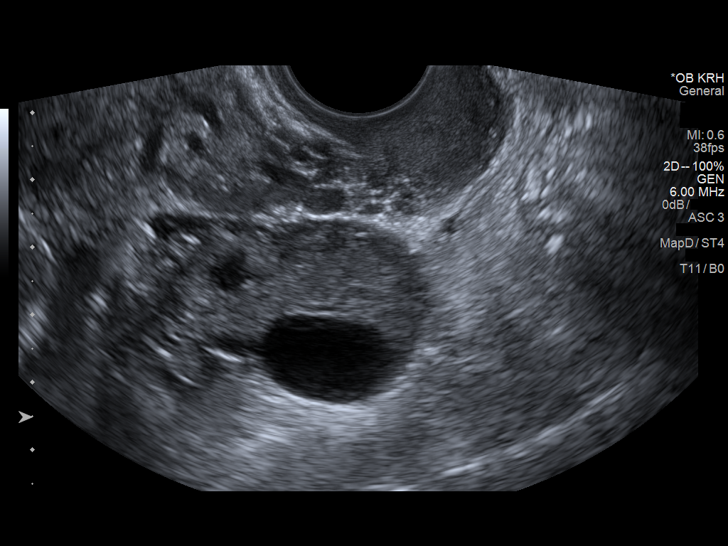
[im 81/115]
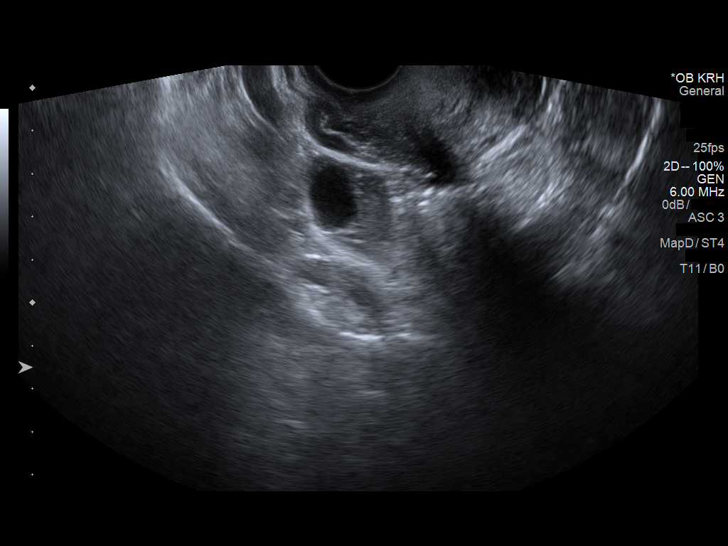
[im 89/115]
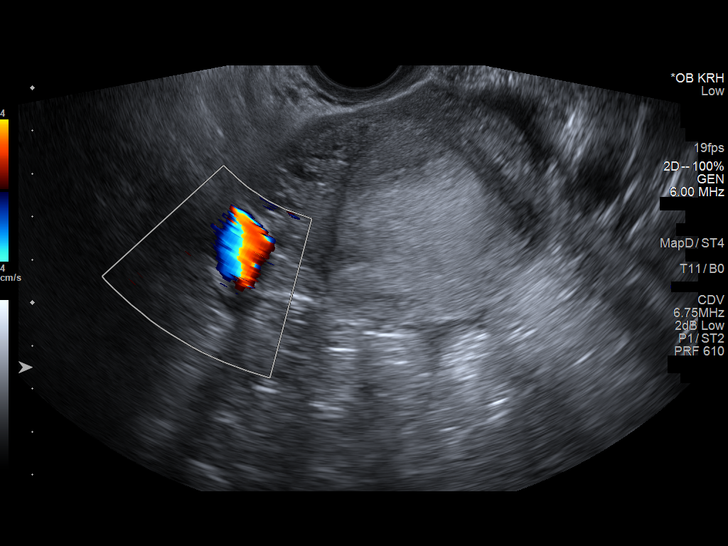
[im 98/115]
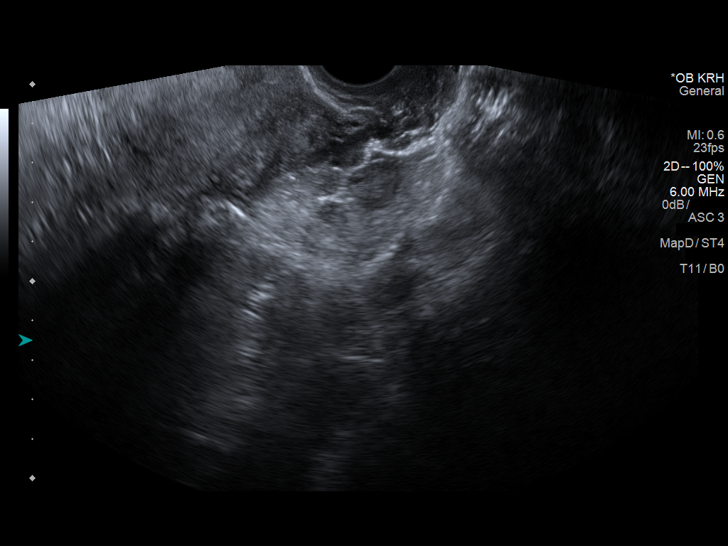
[im 106/115]
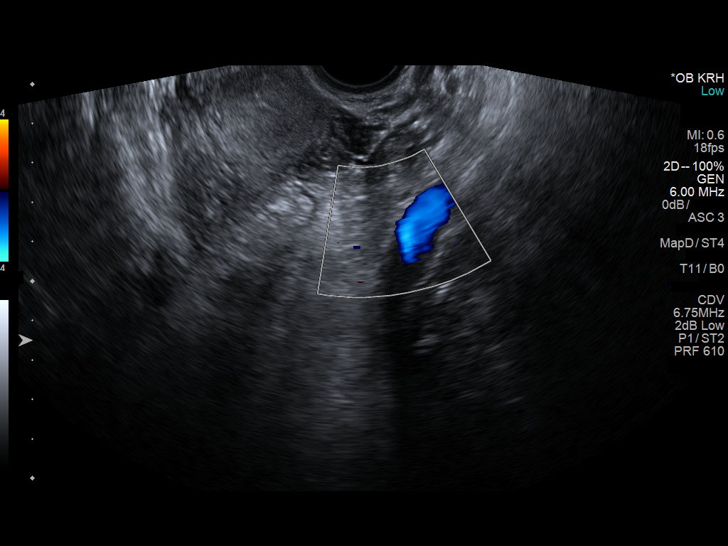
[im 115/115]
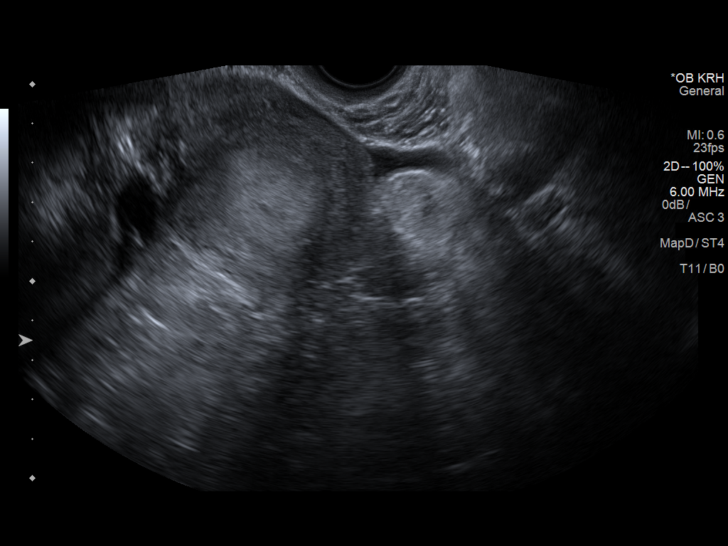

[14 of 28 positions shown; findings below may reference images not displayed]

FINDINGS: Intrauterine gestational sac: None

Yolk sac:  Not Visualized.

Embryo:  Not Visualized.

Maternal uterus/adnexae: Unremarkable.  Right corpus luteum.

Trace free fluid in the pelvis, likely physiologic.
IMPRESSION: No IUP is visualized.

By definition, in the setting of a positive pregnancy test, this
reflects a pregnancy of unknown location. Differential
considerations include early normal IUP, abnormal IUP/missed
abortion, or nonvisualized ectopic pregnancy.

Serial beta HCG is suggested. Consider repeat pelvic ultrasound in
14 days.

## 2019-05-06 IMAGING — US US OB TRANSVAGINAL
1 series · 14 of 28 positions shown · non-contrast
Comparison: Pelvic ultrasound 08/21/2018

CLINICAL DATA: Pregnant patient passing small blood clot.

EXAM:
OBSTETRIC <14 WK US AND TRANSVAGINAL OB US
TECHNIQUE: Both transabdominal and transvaginal ultrasound examinations were
performed for complete evaluation of the gestation as well as the
maternal uterus, adnexal regions, and pelvic cul-de-sac.
Transvaginal technique was performed to assess early pregnancy.

[Series 1: us ob transvaginal · 0.24mm/px · 14 of 49 slices shown]
[im 2/49]
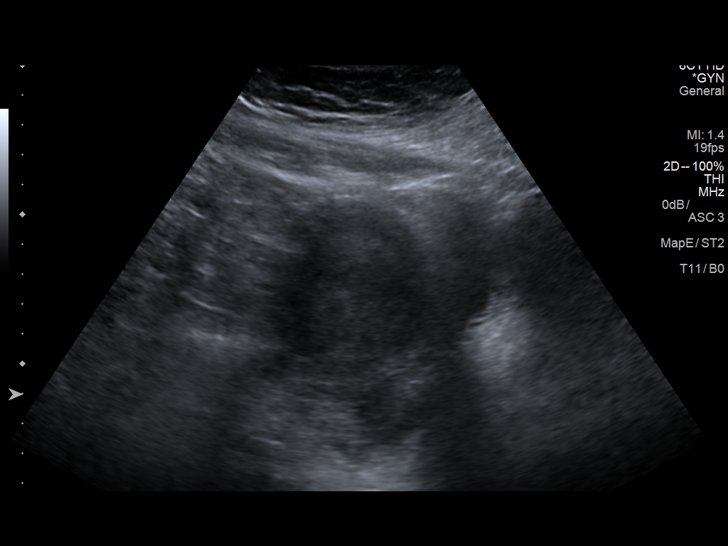
[im 6/49]
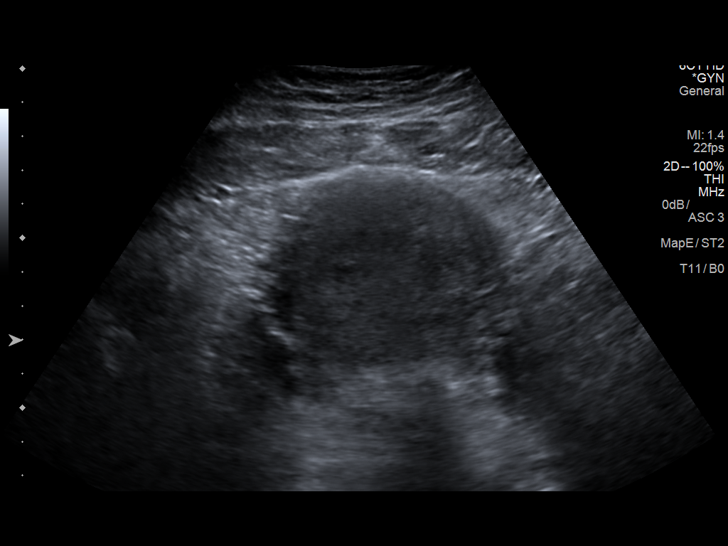
[im 9/49]
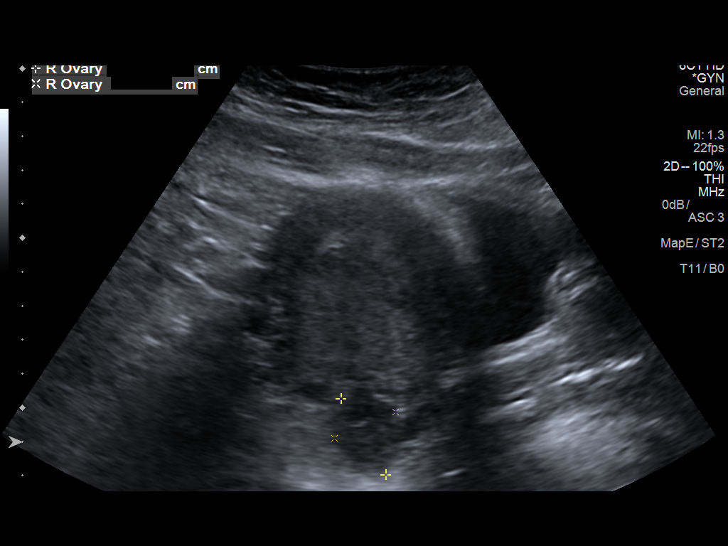
[im 13/49]
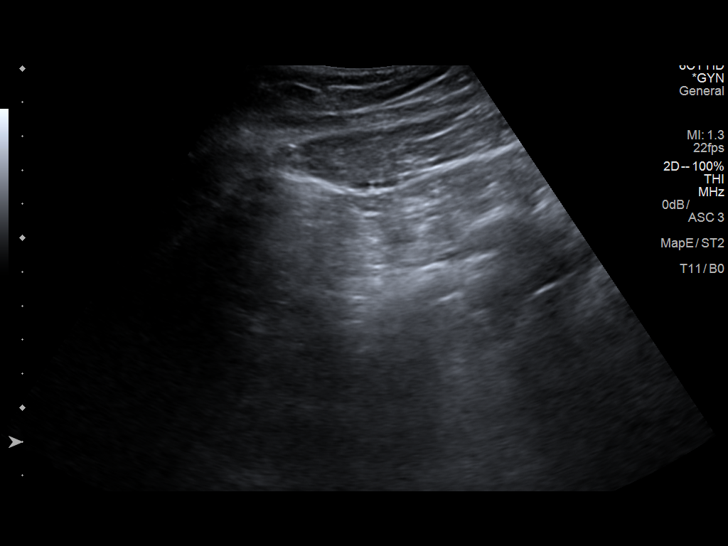
[im 17/49]
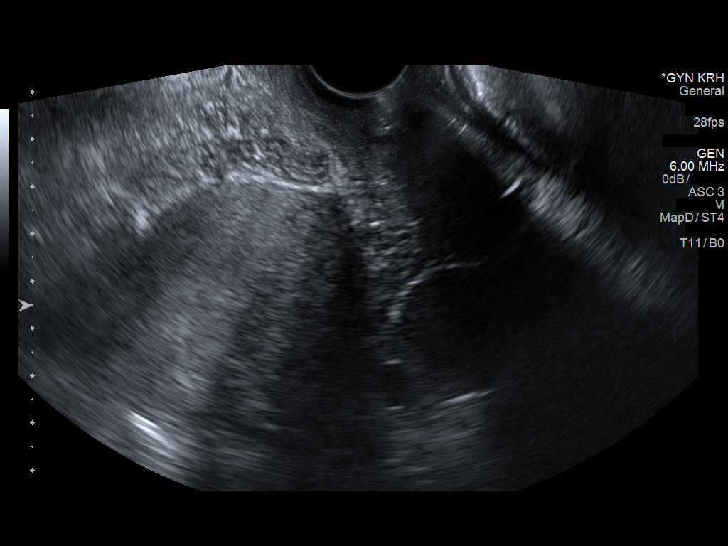
[im 20/49]
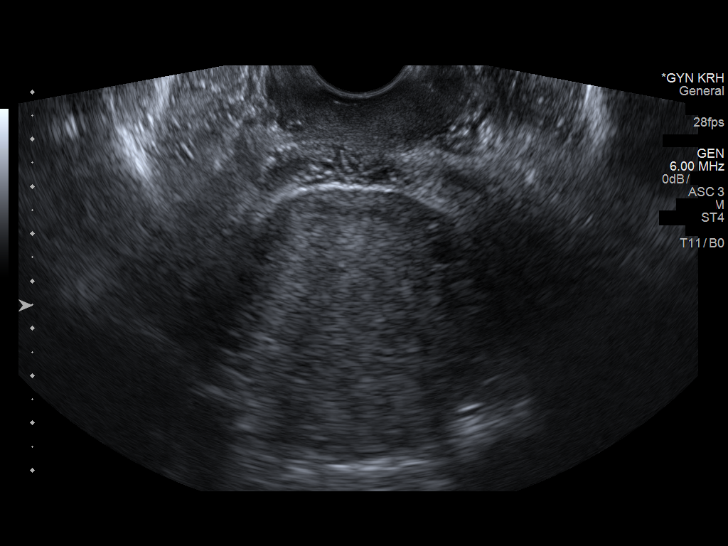
[im 24/49]
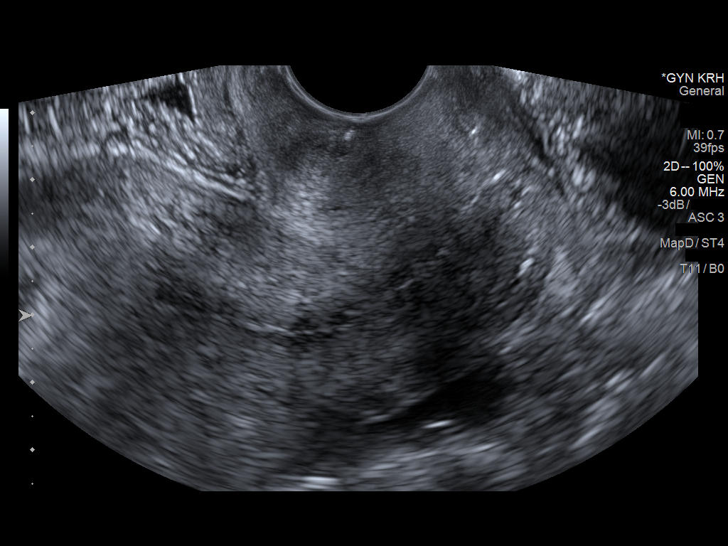
[im 27/49]
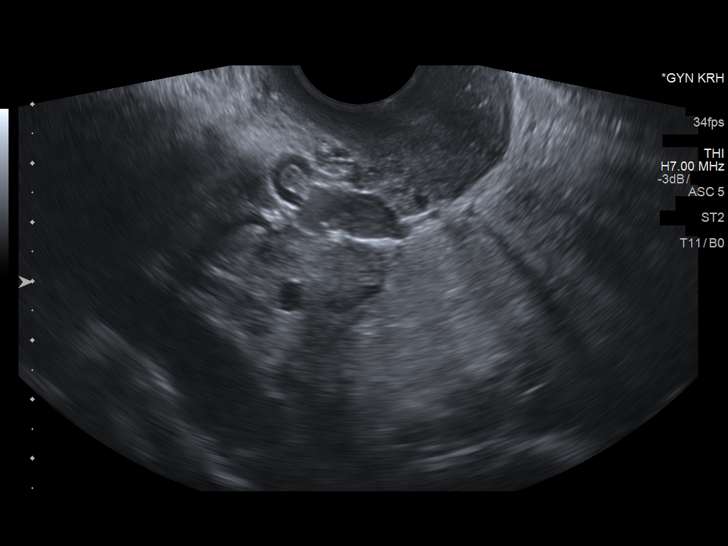
[im 31/49]
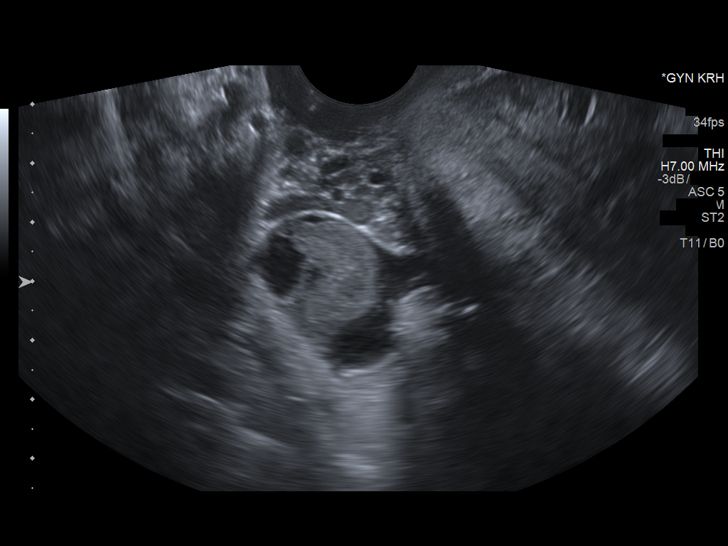
[im 34/49]
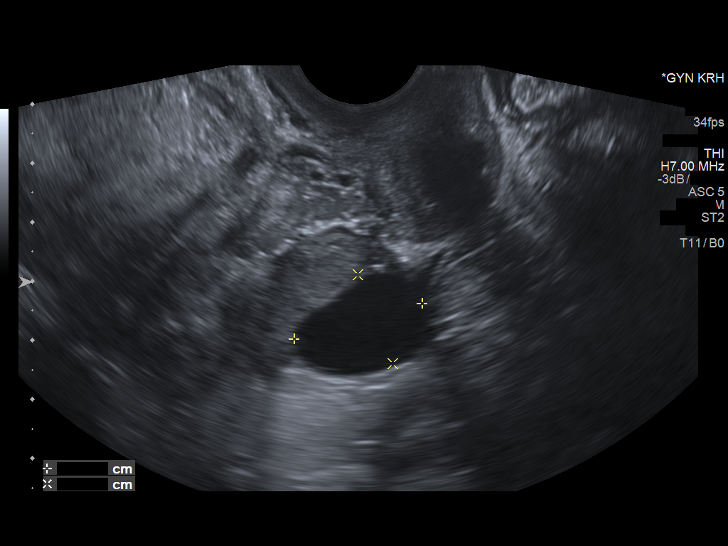
[im 38/49]
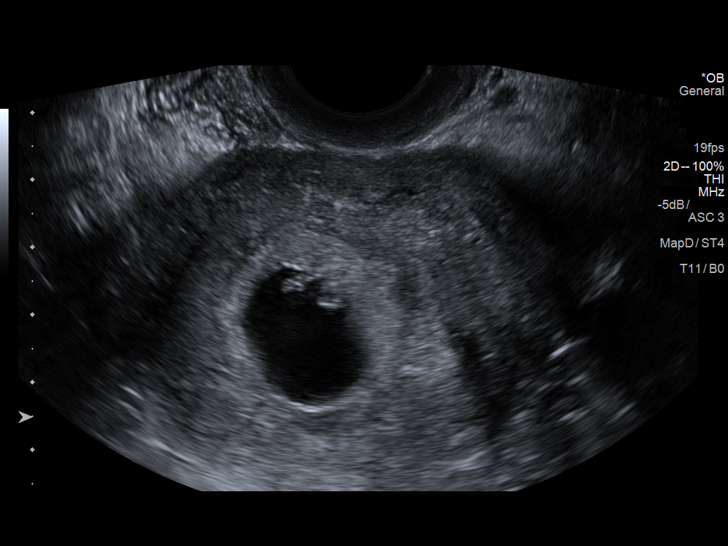
[im 41/49]
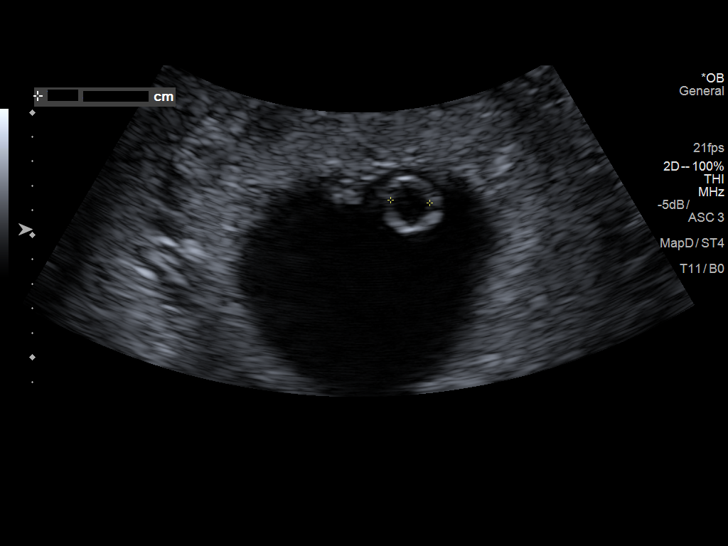
[im 45/49]
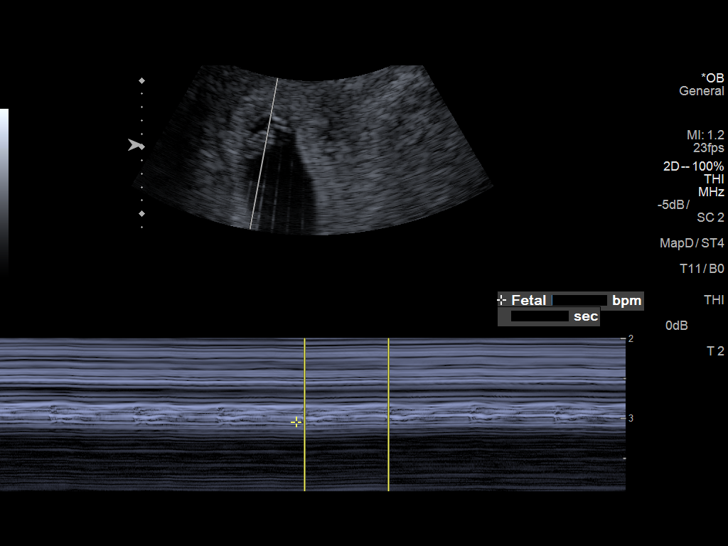
[im 49/49]
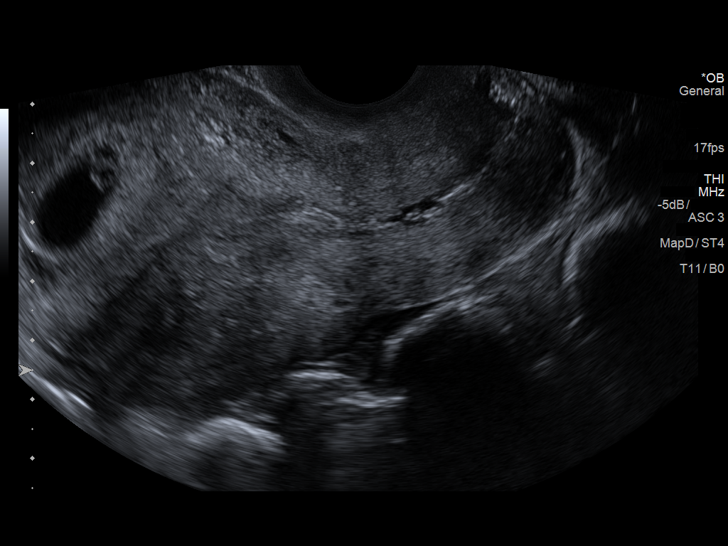

[14 of 28 positions shown; findings below may reference images not displayed]

FINDINGS: Intrauterine gestational sac: Single

Yolk sac:  Visualized.

Embryo:  Visualized.

Cardiac Activity: Visualized.

Heart Rate: 122 bpm

CRL:  6.9 mm   6 w   4 d                  US EDC: 04/27/2019

Subchorionic hemorrhage:  Small

Maternal uterus/adnexae: Normal right and left ovaries. Corpus
luteum right ovary. Trace fluid in the pelvis.
IMPRESSION: Single live intrauterine gestation.  Small subchorionic hemorrhage.

## 2020-02-05 ENCOUNTER — Emergency Department (HOSPITAL_BASED_OUTPATIENT_CLINIC_OR_DEPARTMENT_OTHER)
Admission: EM | Admit: 2020-02-05 | Discharge: 2020-02-05 | Disposition: A | Discharge status: Home / Self Care | Payer: Medicaid Other | Attending: Emergency Medicine | Admitting: Emergency Medicine

## 2020-02-05 ENCOUNTER — Other Ambulatory Visit: Payer: Self-pay

## 2020-02-05 ENCOUNTER — Encounter (HOSPITAL_BASED_OUTPATIENT_CLINIC_OR_DEPARTMENT_OTHER): Payer: Self-pay | Admitting: *Deleted

## 2020-02-05 DIAGNOSIS — B9789 Other viral agents as the cause of diseases classified elsewhere: Secondary | ICD-10-CM

## 2020-02-05 DIAGNOSIS — J018 Other acute sinusitis: Secondary | ICD-10-CM | POA: Insufficient documentation

## 2020-02-05 DIAGNOSIS — Z87891 Personal history of nicotine dependence: Secondary | ICD-10-CM | POA: Diagnosis not present

## 2020-02-05 DIAGNOSIS — U071 COVID-19: Secondary | ICD-10-CM | POA: Diagnosis not present

## 2020-02-05 DIAGNOSIS — R0981 Nasal congestion: Secondary | ICD-10-CM | POA: Diagnosis present

## 2020-02-05 LAB — SARS CORONAVIRUS 2 AG (30 MIN TAT): SARS Coronavirus 2 Ag: POSITIVE — AB

## 2020-02-05 MED ORDER — ACETAMINOPHEN 325 MG PO TABS
650.0000 mg | ORAL_TABLET | Freq: Once | ORAL | Status: AC
  Administered 2020-02-05: 11:00:00 650 mg via ORAL
  Filled 2020-02-05: qty 2

## 2020-02-05 NOTE — Discharge Instructions (Signed)
You have been diagnosed today with Covid-19 virus infection, viral sinusitis.  At this time there does not appear to be the presence of an emergent medical condition, however there is always the potential for conditions to change. Please read and follow the below instructions.  Please return to the Emergency Department immediately for any new or worsening symptoms. Please be sure to follow up with your Primary Care Provider within one week regarding your visit today; please call their office to schedule an appointment even if you are feeling better for a follow-up visit. You have tested positive for COVID-19 viral infection today.  It is important for you to quarantine to avoid potential spread of this illness to others.  Quarantine for at least the next 10 days and follow-up with your primary care provider via televisit for reassessment.  Return to the emergency department immediately for any new or worsening symptoms. Your sinus pressure pressure today is likely due to the viral infection you are experiencing.  There is no evidence of a bacterial infection at this time which would require antibiotics.  You may use nasal saline rinses and over-the-counter sinus medication as directed on the packaging to help with your symptoms.  Please drink plenty water and get plenty of rest.  Get help right away if: You have a very bad headache. You cannot stop throwing up (vomiting). You have very bad pain or swelling around your face or eyes. You have trouble seeing. You feel confused. Your neck is stiff. You have trouble breathing. You have any new/concerning or worsening of symptoms  Get help right away if: You have trouble breathing. You have pain or pressure in your chest. You have confusion. You have bluish lips and fingernails. You have difficulty waking from sleep. You have symptoms that get worse.  Please read the additional information packets attached to your discharge summary.  Do not  take your medicine if  develop an itchy rash, swelling in your mouth or lips, or difficulty breathing; call 911 and seek immediate emergency medical attention if this occurs.  Note: Portions of this text may have been transcribed using voice recognition software. Every effort was made to ensure accuracy; however, inadvertent computerized transcription errors may still be present.

## 2020-02-05 NOTE — ED Notes (Signed)
Presents with c/o HA and facial pain, concerned may have a sinus infection, this past Friday awoke with body aches, pressure at eyes and nose area

## 2020-02-05 NOTE — ED Provider Notes (Addendum)
[facility] EMERGENCY DEPARTMENT Provider Note   CSN: [id number] Arrival date & time: 02/05/20  1001     History Chief Complaint  Patient presents with  . sinus pain    Katherine Padilla is a 29 y.o. female history of obesity otherwise healthy no daily medication use presents today for concern of sinus infection.  Patient reports that Thursday night 02/02/2020 she developed clear rhinorrhea.  She reports that the following day she began to feel some pressure" the front of my face".  She describes a bilateral maxillary sinus pressure moderate intensity onset 2 days ago mild intensity nonradiating no clear aggravating or alleviating factors.  Associated symptoms of fatigue and mild generalized body aches.  Patient reports that she took 1 dose of an over-the-counter "sinus medication" yesterday afternoon with temporary complete relief of her symptoms.  She cannot recall the name of this medication.  She reports that her boyfriend is currently sick with similar symptoms, no known Covid exposures.  Denies fever/chills, headache, vision changes, swelling of the face/head/neck, sore throat, neck pain, cough, shortness of breath, chest pain, abdominal pain, nausea/vomiting, diarrhea, numbness/tingling, weakness or any additional concerns.  HPI     History reviewed. No pertinent past medical history.  There are no problems to display for this patient.   History reviewed. No pertinent surgical history.   OB History    Gravida  1   Para      Term      Preterm      AB      Living        SAB      TAB      Ectopic      Multiple      Live Births              No family history on file.  Social History   Tobacco Use  . Smoking status: Former Games developer  . Smokeless tobacco: Never Used  Substance Use Topics  . Alcohol use: Not Currently  . Drug use: Not Currently    Home Medications Prior to Admission medications   Medication Sig Start Date End Date Taking?  Authorizing Provider  Prenatal Vit-Fe Fumarate-FA (PRENATAL COMPLETE) 14-0.4 MG TABS Take 1 tablet by mouth daily. 08/21/18  Yes Law, Waylan Boga, PA-C    Allergies    Patient has no known allergies.  Review of Systems   Review of Systems  Constitutional: Positive for fatigue. Negative for chills and fever.  HENT: Positive for rhinorrhea and sinus pressure. Negative for facial swelling and sore throat.   Eyes: Negative for pain and visual disturbance.  Respiratory: Negative.  Negative for cough and shortness of breath.   Cardiovascular: Negative.  Negative for chest pain.  Gastrointestinal: Negative.  Negative for abdominal pain, diarrhea, nausea and vomiting.  Musculoskeletal: Positive for myalgias.  Neurological: Negative for dizziness, weakness, numbness and headaches.    Physical Exam Updated Vital Signs BP 140/85 (BP Location: Right Arm)   Pulse (!) 108   Temp 98.4 F (36.9 C) (Oral)   Resp 18   Ht 5\' 2"  (1.575 m)   Wt 119.6 kg   SpO2 100%   BMI 48.21 kg/m   Physical Exam Constitutional:      General: She is not in acute distress.    Appearance: Normal appearance. She is well-developed. She is obese. She is not ill-appearing or diaphoretic.  HENT:     Head: Normocephalic and atraumatic.     Jaw: There is  normal jaw occlusion. No trismus.     Right Ear: Tympanic membrane and external ear normal.     Left Ear: Tympanic membrane and external ear normal.     Nose: Rhinorrhea present. Rhinorrhea is clear.     Right Sinus: Maxillary sinus tenderness present. No frontal sinus tenderness.     Left Sinus: Maxillary sinus tenderness present. No frontal sinus tenderness.     Mouth/Throat:     Mouth: Mucous membranes are moist.     Pharynx: Oropharynx is clear. Uvula midline.  Eyes:     General: Vision grossly intact. Gaze aligned appropriately.     Extraocular Movements: Extraocular movements intact.     Conjunctiva/sclera: Conjunctivae normal.     Pupils: Pupils are  equal, round, and reactive to light.     Comments: Visual fields grossly intact bilateral No pain with extraocular motion  Neck:     Trachea: Trachea and phonation normal. No tracheal deviation.     Meningeal: Brudzinski's sign absent.  Cardiovascular:     Rate and Rhythm: Normal rate and regular rhythm.     Pulses: Normal pulses.     Heart sounds: Normal heart sounds.  Pulmonary:     Effort: Pulmonary effort is normal. No respiratory distress.     Breath sounds: Normal breath sounds.  Abdominal:     General: There is no distension.     Palpations: Abdomen is soft.     Tenderness: There is no abdominal tenderness. There is no guarding or rebound.  Musculoskeletal:        General: Normal range of motion.     Cervical back: Full passive range of motion without pain, normal range of motion and neck supple.  Skin:    General: Skin is warm and dry.  Neurological:     Mental Status: She is alert.     GCS: GCS eye subscore is 4. GCS verbal subscore is 5. GCS motor subscore is 6.     Comments: Speech is clear and goal oriented, follows commands Major Cranial nerves without deficit, no facial droop Moves extremities without ataxia, coordination intact  Psychiatric:        Behavior: Behavior normal.     ED Results / Procedures / Treatments   Labs (all labs ordered are listed, but only abnormal results are displayed) Labs Reviewed  SARS CORONAVIRUS 2 AG (30 MIN TAT) - Abnormal; Notable for the following components:      Result Value   SARS Coronavirus 2 Ag POSITIVE (*)    All other components within normal limits    EKG None  Radiology No results found.  Procedures Procedures (including critical care time)  Medications Ordered in ED Medications  acetaminophen (TYLENOL) tablet 650 mg (650 mg Oral Given 02/05/20 1100)    ED Course  I have reviewed the triage vital signs and the nursing notes.  Pertinent labs & imaging results that were available during my care of the  patient were reviewed by me and considered in my medical decision making (see chart for details).    MDM Rules/Calculators/A&P                     29 year old female presents today for sinus pressure, rhinorrhea and body aches.  Overall well-appearing no acute distress vital signs stable afebrile no hypoxia on room air.  Pulse of 108 documented in triage has resolved on initial evaluation no tachycardia. Cranial nerves intact, no meningeal signs, mild clear rhinorrhea is present, minimal tenderness  to the maxillary sinuses bilaterally without swelling or overlying skin changes, oropharynx clear without evidence of PTA, RPA, Ludwig's or other deep space infections.  Lungs clear to auscultation bilaterally, heart regular rate and rhythm without murmur, abdomen soft nontender without peritoneal signs, neurovascular tact to all 4 extremities without evidence of DVT.  Suspect patient with viral sinusitis at this time, there is no evidence for bacterial sinusitis requiring antibiotics.  Given current COVID-19 pandemic concern patient symptoms may be related to COVID-19 viral infection will do rapid test here in the ED and give patient Tylenol for her sinus pressure.  She has no red flags to necessitate CT imaging of the face, head or neck at this time. - Rapid COVID-19 test is positive.  Patient reassessed, resting comfortably no acute distress talking on the phone with her boyfriend.  Patient has been updated and states understanding.  I had a long discussion about COVID-19 virus with patient and she stated understanding.  She also states understanding that antibiotics not indicated at this time suspect her mild sinus pressure is viral in nature and she states understanding.  Discussed conservative therapies including OTC anti-inflammatories and nasal saline rinses with the patient.  Additionally she denies any respiratory symptoms there is no indication for chest x-ray or any additional work-up here in the ED  discussed home symptomatic therapies and PCP follow-up with patient.  There is no indication for admission at this time.  I have informed patient that she needs to quarantine for the next 10 days and have her close contacts including her boyfriend also quarantine to avoid potential spread of this illness.  At this time there does not appear to be any evidence of an acute emergency medical condition and the patient appears stable for discharge with appropriate outpatient follow up. Diagnosis was discussed with patient who verbalizes understanding of care plan and is agreeable to discharge. I have discussed return precautions with patient who verbalizes understanding of return precautions. Patient encouraged to follow-up with their PCP. All questions answered.  Patient's case discussed with Dr. Sherry Ruffing who agrees with plan to discharge with follow-up.   Katherine Padilla was evaluated in Emergency Department on 02/05/2020 for the symptoms described in the history of present illness. She was evaluated in the context of the global COVID-19 pandemic, which necessitated consideration that the patient might be at risk for infection with the SARS-CoV-2 virus that causes COVID-19. Institutional protocols and algorithms that pertain to the evaluation of patients at risk for COVID-19 are in a state of rapid change based on information released by regulatory bodies including the CDC and federal and state organizations. These policies and algorithms were followed during the patient's care in the ED.  Note: Portions of this report may have been transcribed using voice recognition software. Every effort was made to ensure accuracy; however, inadvertent computerized transcription errors may still be present. Final Clinical Impression(s) / ED Diagnoses Final diagnoses:  COVID-19 virus infection  Viral sinusitis    Rx / DC Orders ED Discharge Orders    None       Deliah Boston, PA-C 02/05/20 1141    Gari Crown 02/05/20 1245    Tegeler, Gwenyth Allegra, MD 02/05/20 [phone]

## 2020-02-05 NOTE — ED Notes (Signed)
COVID+, ED provider and RN informed

## 2020-02-06 ENCOUNTER — Telehealth: Payer: Self-pay | Admitting: Nurse Practitioner

## 2020-02-06 ENCOUNTER — Encounter: Payer: Self-pay | Admitting: Medical

## 2020-02-06 ENCOUNTER — Telehealth: Payer: Self-pay | Admitting: Adult Health

## 2020-02-06 DIAGNOSIS — U071 COVID-19: Secondary | ICD-10-CM | POA: Insufficient documentation

## 2020-02-06 NOTE — Telephone Encounter (Signed)
Called to Discuss with patient about Covid symptoms and the use of bamlanivimab, a monoclonal antibody infusion for those with mild to moderate Covid symptoms and at a high risk of hospitalization.     Pt is qualified for this infusion at the [facility] infusion center due to co-morbid conditions and/or a member of an at-risk group.     Unable to reach pt  

## 2020-02-06 NOTE — Telephone Encounter (Signed)
Called to speak to Katherine Padilla about recent SARS-CoV- 2 positive test, COVID sx's, and Monoclonal Infusion. Her voicemail is full- unable to leave message. MyChart is not active, unable to send message. BMI- 48 Per ED note 02/05/20- sx onset 2/11 Will place her on call back list  William Hamburger, NP-C

## 2020-02-07 ENCOUNTER — Telehealth: Payer: Self-pay | Admitting: Pulmonary Disease

## 2020-02-07 ENCOUNTER — Telehealth: Payer: Self-pay | Admitting: Physician Assistant

## 2020-02-07 NOTE — Telephone Encounter (Signed)
02/07/2020  Second attempt was made to reach the patient.  Unfortunately patient's voicemail is not set up.  Unable to leave callback number.  Patient does not have a PCP on file.  Patient does not have my chart set up.  Patient would qualify for the monoclonal antibody infusion based off of her BMI.  Elisha Headland FNP

## 2020-02-07 NOTE — Telephone Encounter (Signed)
Called again to discuss with [facility] about Covid symptoms and the use of bamlanivimab, a monoclonal antibody infusion for those with mild to moderate Covid symptoms and at a high risk of hospitalization.     Pt is qualified for this infusion at the [facility] infusion center due to co-morbid conditions and/or a member of an at-risk group.  Unable to reach pt over the phone.

## 2020-03-17 ENCOUNTER — Other Ambulatory Visit: Payer: Self-pay

## 2020-03-17 ENCOUNTER — Emergency Department (HOSPITAL_BASED_OUTPATIENT_CLINIC_OR_DEPARTMENT_OTHER)
Admission: EM | Admit: 2020-03-17 | Discharge: 2020-03-17 | Disposition: A | Discharge status: Home / Self Care | Payer: Medicaid Other | Attending: Emergency Medicine | Admitting: Emergency Medicine

## 2020-03-17 ENCOUNTER — Encounter (HOSPITAL_BASED_OUTPATIENT_CLINIC_OR_DEPARTMENT_OTHER): Payer: Self-pay | Admitting: Emergency Medicine

## 2020-03-17 DIAGNOSIS — R1013 Epigastric pain: Secondary | ICD-10-CM | POA: Insufficient documentation

## 2020-03-17 DIAGNOSIS — Z87891 Personal history of nicotine dependence: Secondary | ICD-10-CM | POA: Diagnosis not present

## 2020-03-17 LAB — CBC WITH DIFFERENTIAL/PLATELET
Abs Immature Granulocytes: 0.03 10*3/uL (ref 0.00–0.07)
Basophils Absolute: 0 10*3/uL (ref 0.0–0.1)
Basophils Relative: 0 %
Eosinophils Absolute: 0.3 10*3/uL (ref 0.0–0.5)
Eosinophils Relative: 3 %
HCT: 39.4 % (ref 36.0–46.0)
Hemoglobin: 12.1 g/dL (ref 12.0–15.0)
Immature Granulocytes: 0 %
Lymphocytes Relative: 31 %
Lymphs Abs: 3.7 10*3/uL (ref 0.7–4.0)
MCH: 26.2 pg (ref 26.0–34.0)
MCHC: 30.7 g/dL (ref 30.0–36.0)
MCV: 85.3 fL (ref 80.0–100.0)
Monocytes Absolute: 0.7 10*3/uL (ref 0.1–1.0)
Monocytes Relative: 6 %
Neutro Abs: 7 10*3/uL (ref 1.7–7.7)
Neutrophils Relative %: 60 %
Platelets: 297 10*3/uL (ref 150–400)
RBC: 4.62 MIL/uL (ref 3.87–5.11)
RDW: 15.5 % (ref 11.5–15.5)
WBC: 11.7 10*3/uL — ABNORMAL HIGH (ref 4.0–10.5)
nRBC: 0 % (ref 0.0–0.2)

## 2020-03-17 LAB — COMPREHENSIVE METABOLIC PANEL
ALT: 16 U/L (ref 0–44)
AST: 17 U/L (ref 15–41)
Albumin: 3.3 g/dL — ABNORMAL LOW (ref 3.5–5.0)
Alkaline Phosphatase: 65 U/L (ref 38–126)
Anion gap: 9 (ref 5–15)
BUN: 11 mg/dL (ref 6–20)
CO2: 25 mmol/L (ref 22–32)
Calcium: 9 mg/dL (ref 8.9–10.3)
Chloride: 104 mmol/L (ref 98–111)
Creatinine, Ser: 0.82 mg/dL (ref 0.44–1.00)
GFR calc Af Amer: >60 mL/min (ref >60)
GFR calc non Af Amer: >60 mL/min (ref >60)
Glucose, Bld: 97 mg/dL (ref 70–99)
Potassium: 3.7 mmol/L (ref 3.5–5.1)
Sodium: 138 mmol/L (ref 135–145)
Total Bilirubin: 0.3 mg/dL (ref 0.3–1.2)
Total Protein: 7.6 g/dL (ref 6.5–8.1)

## 2020-03-17 LAB — LIPASE, BLOOD: Lipase: 21 U/L (ref 11–51)

## 2020-03-17 LAB — PREGNANCY, URINE: Preg Test, Ur: NEGATIVE

## 2020-03-17 MED ORDER — SUCRALFATE 1 G PO TABS: 1.0000 g | ORAL_TABLET | Freq: Three times a day (TID) | ORAL | 1 refills | Status: DC

## 2020-03-17 MED ORDER — LIDOCAINE VISCOUS HCL 2 % MT SOLN
15.0000 mL | Freq: Once | OROMUCOSAL | Status: AC
  Administered 2020-03-17: 15 mL via ORAL
  Filled 2020-03-17: qty 15

## 2020-03-17 MED ORDER — ALUM & MAG HYDROXIDE-SIMETH 200-200-20 MG/5ML PO SUSP
30.0000 mL | Freq: Once | ORAL | Status: AC
  Administered 2020-03-17: 19:00:00 30 mL via ORAL
  Filled 2020-03-17: qty 30

## 2020-03-17 NOTE — ED Notes (Signed)
Oxygen requirement and respiratory assessment charted at 1758 charted in error - pt is NOT required any oxygen, LS clear to ausculation.

## 2020-03-17 NOTE — ED Provider Notes (Signed)
[facility] EMERGENCY DEPARTMENT Provider Note   CSN: [id number] Arrival date & time: 03/17/20  1734     History Chief Complaint  Patient presents with  . Gastroesophageal Reflux    Katherine Padilla is a 29 y.o. female with a past medical history of obesity who presents today for evaluation of epigastric and lower chest burning. She reports that on Thursday she ate 2 cans of Vienna sausage and then laid down and since then has been having intermittent feelings of burning in her chest.  She tried Tums without significant relief of her symptoms.  She has been taking omeprazole since Thursday without significant relief.  She states that every time she eats she feels like it "flares up" she denies any fevers, nausea, vomiting, or diarrhea.     HPI     History reviewed. No pertinent past medical history.  Patient Active Problem List   Diagnosis Date Noted  . COVID-19 02/06/2020    History reviewed. No pertinent surgical history.   OB History    Gravida  1   Para      Term      Preterm      AB      Living        SAB      TAB      Ectopic      Multiple      Live Births              History reviewed. No pertinent family history.  Social History   Tobacco Use  . Smoking status: Former Research scientist (life sciences)  . Smokeless tobacco: Never Used  Substance Use Topics  . Alcohol use: Not Currently  . Drug use: Not Currently    Home Medications Prior to Admission medications   Medication Sig Start Date End Date Taking? Authorizing Provider  Prenatal Vit-Fe Fumarate-FA (PRENATAL COMPLETE) 14-0.4 MG TABS Take 1 tablet by mouth daily. 08/21/18   Law, Bea Graff, PA-C  sucralfate (CARAFATE) 1 g tablet Take 1 tablet (1 g total) by mouth 4 (four) times daily -  with meals and at bedtime for 14 days. 03/17/20 03/31/20  Lorin Glass, PA-C    Allergies    Patient has no known allergies.  Review of Systems   Review of Systems  Constitutional: Negative for chills  and fever.  Respiratory: Negative for shortness of breath.   Cardiovascular: Negative for chest pain.  Gastrointestinal: Positive for abdominal pain. Negative for nausea and vomiting.  Musculoskeletal: Negative for back pain.  Neurological: Negative for headaches.  All other systems reviewed and are negative.   Physical Exam Updated Vital Signs BP 134/87 (BP Location: Right Arm)   Pulse 90   Temp 98.6 F (37 C) (Oral)   Resp 20   Ht 5\' 2"  (1.575 m)   Wt 117.4 kg   LMP 03/17/2020   SpO2 99%   Breastfeeding Unknown   BMI 47.34 kg/m   Physical Exam Vitals and nursing note reviewed.  Constitutional:      General: She is not in acute distress.    Appearance: She is well-developed. She is obese. She is not diaphoretic.     Comments: When patient was laid down flat for abdominal exam she reported feeling "the acid rising up into my throat."  HENT:     Head: Normocephalic and atraumatic.  Eyes:     General: No scleral icterus.       Right eye: No discharge.  Left eye: No discharge.     Conjunctiva/sclera: Conjunctivae normal.  Cardiovascular:     Rate and Rhythm: Normal rate and regular rhythm.  Pulmonary:     Effort: Pulmonary effort is normal. No respiratory distress.     Breath sounds: No stridor.  Abdominal:     General: There is no distension.     Palpations: Abdomen is soft.     Tenderness: There is abdominal tenderness (Mild) in the epigastric area. There is no guarding.  Musculoskeletal:        General: No deformity.     Cervical back: Normal range of motion.  Skin:    General: Skin is warm and dry.  Neurological:     General: No focal deficit present.     Mental Status: She is alert.     Cranial Nerves: No cranial nerve deficit.     Motor: No abnormal muscle tone.  Psychiatric:        Mood and Affect: Mood is anxious.        Behavior: Behavior normal.     ED Results / Procedures / Treatments   Labs (all labs ordered are listed, but only abnormal  results are displayed) Labs Reviewed  COMPREHENSIVE METABOLIC PANEL - Abnormal; Notable for the following components:      Result Value   Albumin 3.3 (*)    All other components within normal limits  CBC WITH DIFFERENTIAL/PLATELET - Abnormal; Notable for the following components:   WBC 11.7 (*)    All other components within normal limits  PREGNANCY, URINE  LIPASE, BLOOD    EKG None  Radiology No results found.  Procedures Procedures (including critical care time)  Medications Ordered in ED Medications  alum & mag hydroxide-simeth (MAALOX/MYLANTA) 200-200-20 MG/5ML suspension 30 mL (30 mLs Oral Given 03/17/20 1844)    And  lidocaine (XYLOCAINE) 2 % viscous mouth solution 15 mL (15 mLs Oral Given 03/17/20 1844)    ED Course  I have reviewed the triage vital signs and the nursing notes.  Pertinent labs & imaging results that were available during my care of the patient were reviewed by me and considered in my medical decision making (see chart for details).    MDM Rules/Calculators/A&P                      Patient is a obese but otherwise healthy 29 year old woman who presents today for evaluation of burning feelings in her chest.  She reports that she ate 2 cans of VNS sausages and since then has had intermittent burning feelings that happen after she eats. Her physical exam is reassuring, vitals are reassuring. Labs obtained and reviewed without significant hematologic or electrolyte derangements.  She was treated with GI cocktail of Maalox and lidocaine, after which her symptoms fully resolved.  Given the clear association of her symptoms with eating, along with her reporting that she lays down after eating and her symptoms being relieved with GERD treatment suspect GERD.  We discussed appropriate outpatient treatment.  She is instructed to continue with omeprazole in addition to taking Carafate.  She was not consistently tachycardic or tachypneic.  She does report feeling  significantly anxious overall.  Do not suspect acute cardiac or significant pulmonary cause for her symptoms today.  Return precautions were discussed with patient who states their understanding.  At the time of discharge patient denied any unaddressed complaints or concerns.  Patient is agreeable for discharge home.  Note: Portions of this report  may have been transcribed using voice recognition software. Every effort was made to ensure accuracy; however, inadvertent computerized transcription errors may be present  Final Clinical Impression(s) / ED Diagnoses Final diagnoses:  Epigastric pain    Rx / DC Orders ED Discharge Orders         Ordered    sucralfate (CARAFATE) 1 g tablet  3 times daily with meals & bedtime     03/17/20 2011           Cristina Gong, Cordelia Poche 03/18/20 0040    Rolan Bucco, MD 03/18/20 [phone]

## 2020-03-17 NOTE — ED Triage Notes (Signed)
Pt states that she ate some vienna sausages on Thursday and since she has had burning in her throat and chest since then. The patient states that it is worse after eating

## 2020-03-17 NOTE — Discharge Instructions (Signed)
Please keep taking the omeprazole.  It is important that you take it first thing in the morning before you get hungry. Please also consider some of the dietary changes.  You need to stay sitting upright for at least 2 hours after using it.

## 2020-04-30 ENCOUNTER — Emergency Department (HOSPITAL_BASED_OUTPATIENT_CLINIC_OR_DEPARTMENT_OTHER)
Admission: EM | Admit: 2020-04-30 | Discharge: 2020-04-30 | Disposition: A | Discharge status: Home / Self Care | Payer: Medicaid Other | Attending: Emergency Medicine | Admitting: Emergency Medicine

## 2020-04-30 ENCOUNTER — Encounter (HOSPITAL_BASED_OUTPATIENT_CLINIC_OR_DEPARTMENT_OTHER): Payer: Self-pay

## 2020-04-30 ENCOUNTER — Other Ambulatory Visit: Payer: Self-pay

## 2020-04-30 DIAGNOSIS — R531 Weakness: Secondary | ICD-10-CM | POA: Insufficient documentation

## 2020-04-30 DIAGNOSIS — R42 Dizziness and giddiness: Secondary | ICD-10-CM | POA: Diagnosis not present

## 2020-04-30 DIAGNOSIS — Z8616 Personal history of COVID-19: Secondary | ICD-10-CM | POA: Diagnosis not present

## 2020-04-30 DIAGNOSIS — Z87891 Personal history of nicotine dependence: Secondary | ICD-10-CM | POA: Insufficient documentation

## 2020-04-30 LAB — COMPREHENSIVE METABOLIC PANEL
ALT: 24 U/L (ref 0–44)
AST: 20 U/L (ref 15–41)
Albumin: 3.6 g/dL (ref 3.5–5.0)
Alkaline Phosphatase: 66 U/L (ref 38–126)
Anion gap: 10 (ref 5–15)
BUN: 13 mg/dL (ref 6–20)
CO2: 26 mmol/L (ref 22–32)
Calcium: 9.4 mg/dL (ref 8.9–10.3)
Chloride: 103 mmol/L (ref 98–111)
Creatinine, Ser: 0.93 mg/dL (ref 0.44–1.00)
GFR calc Af Amer: >60 mL/min (ref >60)
GFR calc non Af Amer: >60 mL/min (ref >60)
Glucose, Bld: 99 mg/dL (ref 70–99)
Potassium: 3.8 mmol/L (ref 3.5–5.1)
Sodium: 139 mmol/L (ref 135–145)
Total Bilirubin: 0.6 mg/dL (ref 0.3–1.2)
Total Protein: 8.3 g/dL — ABNORMAL HIGH (ref 6.5–8.1)

## 2020-04-30 LAB — PREGNANCY, URINE: Preg Test, Ur: NEGATIVE

## 2020-04-30 LAB — URINALYSIS, ROUTINE W REFLEX MICROSCOPIC
Bilirubin Urine: NEGATIVE
Glucose, UA: NEGATIVE mg/dL
Hgb urine dipstick: NEGATIVE
Ketones, ur: NEGATIVE mg/dL
Leukocytes,Ua: NEGATIVE
Nitrite: NEGATIVE
Protein, ur: NEGATIVE mg/dL
Specific Gravity, Urine: 1.025 (ref 1.005–1.030)
pH: 6 (ref 5.0–8.0)

## 2020-04-30 LAB — CBC WITH DIFFERENTIAL/PLATELET
Abs Immature Granulocytes: 0.03 10*3/uL (ref 0.00–0.07)
Basophils Absolute: 0.1 10*3/uL (ref 0.0–0.1)
Basophils Relative: 1 %
Eosinophils Absolute: 0.2 10*3/uL (ref 0.0–0.5)
Eosinophils Relative: 2 %
HCT: 39.2 % (ref 36.0–46.0)
Hemoglobin: 12.7 g/dL (ref 12.0–15.0)
Immature Granulocytes: 0 %
Lymphocytes Relative: 26 %
Lymphs Abs: 2.2 10*3/uL (ref 0.7–4.0)
MCH: 26.9 pg (ref 26.0–34.0)
MCHC: 32.4 g/dL (ref 30.0–36.0)
MCV: 83.1 fL (ref 80.0–100.0)
Monocytes Absolute: 0.4 10*3/uL (ref 0.1–1.0)
Monocytes Relative: 4 %
Neutro Abs: 5.6 10*3/uL (ref 1.7–7.7)
Neutrophils Relative %: 67 %
Platelets: 280 10*3/uL (ref 150–400)
RBC: 4.72 MIL/uL (ref 3.87–5.11)
RDW: 15.2 % (ref 11.5–15.5)
WBC: 8.5 10*3/uL (ref 4.0–10.5)
nRBC: 0 % (ref 0.0–0.2)

## 2020-04-30 MED ORDER — LACTATED RINGERS IV BOLUS
1000.0000 mL | Freq: Once | INTRAVENOUS | Status: AC
  Administered 2020-04-30: 1000 mL via INTRAVENOUS

## 2020-04-30 NOTE — ED Triage Notes (Addendum)
Pt states past month has been feeling dizzy/weak at times.  Has been evaluated for GERD, states dietary changes causing low po intake, has hx anemia.  Ambulates with steady gait.  No acute distress. Current treatment for sinus infection with augmentin.

## 2020-04-30 NOTE — ED Provider Notes (Signed)
[facility] EMERGENCY DEPARTMENT Provider Note   CSN: [id number] Arrival date & time: 04/30/20  0865     History Chief Complaint  Patient presents with  . Dizziness    Katherine Padilla is a 29 y.o. female.  29 year old female who presents with dizziness.  In March, patient began having problems with difficulty swallowing, pain after she swallows, and reflux.  She was started on reflux medications which initially helped her symptoms but then she stopped the medications and symptoms got worse.  She saw gastroenterologist and was offered EGD but declined initially.  Her symptoms have gotten worse recently so she called the GI clinic and is scheduled for an upcoming EGD.  She has completely changed her diet and cut out all sweets, greasy/fatty foods, and high acid foods.  She notes that she has not wanted to eat as much because of her symptoms.  She denies any vomiting, diarrhea, constipation, or blood in her stool.  She has been feeling dizzy/weak at times especially when standing up.  She notes a distant history of anemia, was previously on iron but had not been taking it lately.  She restarted iron a week ago.  No urinary symptoms or fevers.  She does admit to being anxious, concerned that she may have cancer.  The history is provided by the patient.  Dizziness      History reviewed. No pertinent past medical history.  Patient Active Problem List   Diagnosis Date Noted  . COVID-19 02/06/2020    History reviewed. No pertinent surgical history.   OB History    Gravida  1   Para      Term      Preterm      AB      Living        SAB      TAB      Ectopic      Multiple      Live Births              History reviewed. No pertinent family history.  Social History   Tobacco Use  . Smoking status: Former Games developer  . Smokeless tobacco: Never Used  Substance Use Topics  . Alcohol use: Not Currently  . Drug use: Not Currently    Home Medications Prior  to Admission medications   Medication Sig Start Date End Date Taking? Authorizing Provider  amoxicillin-clavulanate (AUGMENTIN) 875-125 MG tablet Take 1 tablet by mouth 2 (two) times daily. 04/22/20   [provider]  ferrous sulfate 325 (65 FE) MG tablet Take by mouth.    [provider]  Prenatal Vit-Fe Fumarate-FA (PRENATAL COMPLETE) 14-0.4 MG TABS Take 1 tablet by mouth daily. 08/21/18   Law, Waylan Boga, PA-C  sucralfate (CARAFATE) 1 g tablet Take 1 tablet (1 g total) by mouth 4 (four) times daily -  with meals and at bedtime for 14 days. 03/17/20 03/31/20  Cristina Gong, PA-C    Allergies    Patient has no known allergies.  Review of Systems   Review of Systems  Neurological: Positive for dizziness.   All other systems reviewed and are negative except that which was mentioned in HPI  Physical Exam Updated Vital Signs BP (!) 145/98 (BP Location: Right Arm)   Pulse (!) 107   Temp 98.3 F (36.8 C) (Oral)   Resp 15   Ht 5\' 2"  (1.575 m)   Wt 109.1 kg   SpO2 100%   BMI 44.01 kg/m  Physical Exam Vitals and nursing note reviewed.  Constitutional:      General: She is not in acute distress.    Appearance: She is well-developed.  HENT:     Head: Normocephalic and atraumatic.  Eyes:     Conjunctiva/sclera: Conjunctivae normal.  Cardiovascular:     Rate and Rhythm: Regular rhythm. Tachycardia present.     Heart sounds: Normal heart sounds. No murmur.  Pulmonary:     Effort: Pulmonary effort is normal.     Breath sounds: Normal breath sounds.  Abdominal:     General: Bowel sounds are normal. There is no distension.     Palpations: Abdomen is soft.     Tenderness: There is no abdominal tenderness.  Musculoskeletal:     Cervical back: Neck supple.  Skin:    General: Skin is warm and dry.  Neurological:     Mental Status: She is alert and oriented to person, place, and time.     Comments: Fluent speech  Psychiatric:        Mood and Affect: Mood is  anxious.        Judgment: Judgment normal.     ED Results / Procedures / Treatments   Labs (all labs ordered are listed, but only abnormal results are displayed) Labs Reviewed  COMPREHENSIVE METABOLIC PANEL - Abnormal; Notable for the following components:      Result Value   Total Protein 8.3 (*)    All other components within normal limits  CBC WITH DIFFERENTIAL/PLATELET  PREGNANCY, URINE  URINALYSIS, ROUTINE W REFLEX MICROSCOPIC    EKG EKG Interpretation  Date/Time:  Monday Apr 30 2020 08:56:17 EDT Ventricular Rate:  108 PR Interval:    QRS Duration: 84 QT Interval:  390 QTC Calculation: 523 R Axis:   70 Text Interpretation: Sinus tachycardia Borderline repolarization abnormality Prolonged QT interval No previous ECGs available Confirmed by Theotis Burrow [phone]) on 04/30/2020 9:13:39 AM   Radiology No results found.  Procedures Procedures (including critical care time)  Medications Ordered in ED Medications  lactated ringers bolus 1,000 mL (1,000 mLs Intravenous New Bag/Given 04/30/20 0956)    ED Course  I have reviewed the triage vital signs and the nursing notes.  Pertinent labs that were available during my care of the patient were reviewed by me and considered in my medical decision making (see chart for details).    MDM Rules/Calculators/A&P                      Well-appearing, mildly anxious but no acute distress on exam.  She was initially mildly tachycardic during my exam but heart rate returned to normal when I was not in the room.  No complaints of abdominal pain.  I have reviewed her recent GI clinic note and it sounds like she has EGD already scheduled.Labs today normal w/ normal CMP, CBC, UA.  No evidence of anemia or dehydration.  I have discussed importance of staying hydrated and following up with GI to have EGD for further evaluation of her chronic symptoms.  Instructed to continue medications and dietary changes for now.  She voiced  understanding. Final Clinical Impression(s) / ED Diagnoses Final diagnoses:  None    Rx / DC Orders ED Discharge Orders    None       Shaw Dobek, Wenda Overland, MD 04/30/20 1020

## 2020-11-14 ENCOUNTER — Other Ambulatory Visit (HOSPITAL_BASED_OUTPATIENT_CLINIC_OR_DEPARTMENT_OTHER): Payer: Self-pay | Admitting: Emergency Medicine

## 2020-11-14 ENCOUNTER — Other Ambulatory Visit: Payer: Self-pay

## 2020-11-14 ENCOUNTER — Encounter (HOSPITAL_BASED_OUTPATIENT_CLINIC_OR_DEPARTMENT_OTHER): Payer: Self-pay

## 2020-11-14 ENCOUNTER — Emergency Department (HOSPITAL_BASED_OUTPATIENT_CLINIC_OR_DEPARTMENT_OTHER)
Admission: EM | Admit: 2020-11-14 | Discharge: 2020-11-14 | Disposition: A | Discharge status: Home / Self Care | Payer: Medicaid Other | Attending: Emergency Medicine | Admitting: Emergency Medicine

## 2020-11-14 DIAGNOSIS — Z87891 Personal history of nicotine dependence: Secondary | ICD-10-CM | POA: Insufficient documentation

## 2020-11-14 DIAGNOSIS — N939 Abnormal uterine and vaginal bleeding, unspecified: Secondary | ICD-10-CM | POA: Insufficient documentation

## 2020-11-14 DIAGNOSIS — R42 Dizziness and giddiness: Secondary | ICD-10-CM | POA: Insufficient documentation

## 2020-11-14 DIAGNOSIS — Z8616 Personal history of COVID-19: Secondary | ICD-10-CM | POA: Insufficient documentation

## 2020-11-14 LAB — URINALYSIS, ROUTINE W REFLEX MICROSCOPIC
Bilirubin Urine: NEGATIVE
Glucose, UA: NEGATIVE mg/dL
Ketones, ur: NEGATIVE mg/dL
Leukocytes,Ua: NEGATIVE
Nitrite: NEGATIVE
Protein, ur: NEGATIVE mg/dL
Specific Gravity, Urine: >1.03 — ABNORMAL HIGH (ref 1.005–1.030)
pH: 5.5 (ref 5.0–8.0)

## 2020-11-14 LAB — CBC WITH DIFFERENTIAL/PLATELET
Abs Immature Granulocytes: 0.02 10*3/uL (ref 0.00–0.07)
Basophils Absolute: 0.1 10*3/uL (ref 0.0–0.1)
Basophils Relative: 1 %
Eosinophils Absolute: 0.3 10*3/uL (ref 0.0–0.5)
Eosinophils Relative: 3 %
HCT: 34.9 % — ABNORMAL LOW (ref 36.0–46.0)
Hemoglobin: 11.2 g/dL — ABNORMAL LOW (ref 12.0–15.0)
Immature Granulocytes: 0 %
Lymphocytes Relative: 22 %
Lymphs Abs: 2.4 10*3/uL (ref 0.7–4.0)
MCH: 26.7 pg (ref 26.0–34.0)
MCHC: 32.1 g/dL (ref 30.0–36.0)
MCV: 83.3 fL (ref 80.0–100.0)
Monocytes Absolute: 0.4 10*3/uL (ref 0.1–1.0)
Monocytes Relative: 3 %
Neutro Abs: 7.6 10*3/uL (ref 1.7–7.7)
Neutrophils Relative %: 71 %
Platelets: 270 10*3/uL (ref 150–400)
RBC: 4.19 MIL/uL (ref 3.87–5.11)
RDW: 15.1 % (ref 11.5–15.5)
WBC: 10.7 10*3/uL — ABNORMAL HIGH (ref 4.0–10.5)
nRBC: 0 % (ref 0.0–0.2)

## 2020-11-14 LAB — BASIC METABOLIC PANEL
Anion gap: 11 (ref 5–15)
BUN: 10 mg/dL (ref 6–20)
CO2: 26 mmol/L (ref 22–32)
Calcium: 8.8 mg/dL — ABNORMAL LOW (ref 8.9–10.3)
Chloride: 101 mmol/L (ref 98–111)
Creatinine, Ser: 0.79 mg/dL (ref 0.44–1.00)
GFR, Estimated: >60 mL/min (ref >60)
Glucose, Bld: 102 mg/dL — ABNORMAL HIGH (ref 70–99)
Potassium: 3.7 mmol/L (ref 3.5–5.1)
Sodium: 138 mmol/L (ref 135–145)

## 2020-11-14 LAB — PREGNANCY, URINE: Preg Test, Ur: NEGATIVE

## 2020-11-14 LAB — URINALYSIS, MICROSCOPIC (REFLEX)

## 2020-11-14 MED ORDER — NAPROXEN 250 MG PO TABS: 250.0000 mg | ORAL_TABLET | Freq: Two times a day (BID) | ORAL | 0 refills | Status: AC

## 2020-11-14 MED FILL — NAPROXEN 500 MG TABS: 500 | 10 days supply | Qty: 10 | Fill #0

## 2020-11-14 NOTE — ED Triage Notes (Signed)
Pt arrives with c/o heavy vaginal bleeding states that she has a IUD and was told she may have some bleeding but reports the bleeding has remained heavy. Pt reports she has started feeling weak and dizzy yesterday. Pt states she has been using more tampons than normal.

## 2020-11-14 NOTE — ED Notes (Signed)
ED Provider at bedside. 

## 2020-11-14 NOTE — ED Triage Notes (Signed)
triage delayed r/t patient being in restroom

## 2020-11-14 NOTE — ED Provider Notes (Signed)
[facility] EMERGENCY DEPARTMENT Provider Note   CSN: [id number] Arrival date & time: 11/14/20  9326     History Chief Complaint  Patient presents with  . Vaginal Bleeding    Katherine Padilla is a 29 y.o. female.  Patient had copper IUD placed 2 months ago.  Has had some irregular bleeding since.  Having some heavier than normal bleeding the last 4 days.  Had some episodes of dizziness but none currently.  Is on multivitamin.  Denies any abdominal pain.  No nausea, no vomiting.  No concern for pregnancy.  The history is provided by the patient.  Vaginal Bleeding Quality:  Heavier than menses Severity:  Mild Onset quality:  Gradual Duration:  4 days Timing:  Intermittent Progression:  Waxing and waning Chronicity:  New Menstrual history:  Irregular Possible pregnancy: no   Context: spontaneously   Relieved by:  Nothing Worsened by:  Nothing Associated symptoms: dizziness   Associated symptoms: no abdominal pain, no back pain, no dyspareunia, no dysuria, no fatigue, no fever, no nausea and no vaginal discharge        History reviewed. No pertinent past medical history.  Patient Active Problem List   Diagnosis Date Noted  . COVID-19 02/06/2020    History reviewed. No pertinent surgical history.   OB History    Gravida  1   Para      Term      Preterm      AB      Living        SAB      TAB      Ectopic      Multiple      Live Births              No family history on file.  Social History   Tobacco Use  . Smoking status: Former Games developer  . Smokeless tobacco: Never Used  Vaping Use  . Vaping Use: Never used  Substance Use Topics  . Alcohol use: Not Currently  . Drug use: Not Currently    Home Medications Prior to Admission medications   Medication Sig Start Date End Date Taking? Authorizing Provider  amoxicillin-clavulanate (AUGMENTIN) 875-125 MG tablet Take 1 tablet by mouth 2 (two) times daily. 04/22/20   [provider]  ferrous sulfate 325 (65 FE) MG tablet Take by mouth.    [provider]  naproxen (NAPROSYN) 250 MG tablet Take 1 tablet (250 mg total) by mouth 2 (two) times daily with a meal for 10 days. 11/14/20 11/24/20  Virgina Norfolk, DO  Prenatal Vit-Fe Fumarate-FA (PRENATAL COMPLETE) 14-0.4 MG TABS Take 1 tablet by mouth daily. 08/21/18   Law, Waylan Boga, PA-C  sucralfate (CARAFATE) 1 g tablet Take 1 tablet (1 g total) by mouth 4 (four) times daily -  with meals and at bedtime for 14 days. 03/17/20 03/31/20  Cristina Gong, PA-C    Allergies    Patient has no known allergies.  Review of Systems   Review of Systems  Constitutional: Negative for chills, fatigue and fever.  HENT: Negative for ear pain and sore throat.   Eyes: Negative for pain and visual disturbance.  Respiratory: Negative for cough and shortness of breath.   Cardiovascular: Negative for chest pain and palpitations.  Gastrointestinal: Negative for abdominal pain, nausea and vomiting.  Genitourinary: Positive for vaginal bleeding. Negative for dyspareunia, dysuria, hematuria and vaginal discharge.  Musculoskeletal: Negative for arthralgias and back pain.  Skin: Negative for color  change and rash.  Neurological: Positive for dizziness. Negative for seizures and syncope.  All other systems reviewed and are negative.   Physical Exam Updated Vital Signs BP 133/81   Pulse (!) 103   Temp 98.1 F (36.7 C) (Oral)   Resp 18   Ht 5\' 2"  (1.575 m)   Wt 114.3 kg   LMP 11/14/2020   SpO2 100%   BMI 46.09 kg/m   Physical Exam Vitals and nursing note reviewed.  Constitutional:      General: She is not in acute distress.    Appearance: She is well-developed.  HENT:     Head: Normocephalic and atraumatic.  Eyes:     Conjunctiva/sclera: Conjunctivae normal.  Cardiovascular:     Rate and Rhythm: Normal rate and regular rhythm.     Heart sounds: No murmur heard.   Pulmonary:     Effort: Pulmonary effort  is normal. No respiratory distress.     Breath sounds: Normal breath sounds.  Abdominal:     General: There is no distension.     Palpations: Abdomen is soft.     Tenderness: There is no abdominal tenderness. There is no guarding or rebound.  Musculoskeletal:     Cervical back: Neck supple.  Skin:    General: Skin is warm and dry.  Neurological:     Mental Status: She is alert.     ED Results / Procedures / Treatments   Labs (all labs ordered are listed, but only abnormal results are displayed) Labs Reviewed  CBC WITH DIFFERENTIAL/PLATELET - Abnormal; Notable for the following components:      Result Value   WBC 10.7 (*)    Hemoglobin 11.2 (*)    HCT 34.9 (*)    All other components within normal limits  BASIC METABOLIC PANEL - Abnormal; Notable for the following components:   Glucose, Bld 102 (*)    Calcium 8.8 (*)    All other components within normal limits  URINALYSIS, ROUTINE W REFLEX MICROSCOPIC - Abnormal; Notable for the following components:   Specific Gravity, Urine >1.030 (*)    Hgb urine dipstick LARGE (*)    All other components within normal limits  URINALYSIS, MICROSCOPIC (REFLEX) - Abnormal; Notable for the following components:   Bacteria, UA MANY (*)    All other components within normal limits  PREGNANCY, URINE    EKG None  Radiology No results found.  Procedures Procedures (including critical care time)  Medications Ordered in ED Medications - No data to display  ED Course  I have reviewed the triage vital signs and the nursing notes.  Pertinent labs & imaging results that were available during my care of the patient were reviewed by me and considered in my medical decision making (see chart for details).    MDM Rules/Calculators/A&P                          [facility] is here for vaginal bleeding.  Normal vitals.  No fever.  Worried about having low blood counts that she has had slightly heavier than normal bleeding last several days.   Was on Mirena IUD up until 2 months ago when she switched out the copper IUD because she did not like how hormones made her feel.  She did not have menstrual cycles while on Mirena but now having some irregular cycles over the last 2 months.  No abdominal pain.  No trauma.  Pregnancy test is negative.  Will check blood counts to see if she has any severe anemia.  Overall suspect that she is normalizing while on new IUD.  Suspect I will prescribe naproxen.  GU exam was deferred as not medically necessary.  We will have her follow-up with gynecology.  She states that she missed appointment 2 weeks ago.  Lab work unremarkable.  Hemoglobin 11.2.  No significant anemia or electrolyte abnormality.  Pregnancy test negative.  Prescribed naproxen.  Recommend follow-up with OB/GYN.  Suspect abnormal uterine bleeding.  This chart was dictated using voice recognition software.  Despite best efforts to proofread,  errors can occur which can change the documentation meaning.   Final Clinical Impression(s) / ED Diagnoses Final diagnoses:  Abnormal uterine bleeding (AUB)    Rx / DC Orders ED Discharge Orders         Ordered    naproxen (NAPROSYN) 250 MG tablet  2 times daily with meals        11/14/20 0943           Virgina Norfolk, DO 11/14/20 [phone]

## 2020-11-18 ENCOUNTER — Other Ambulatory Visit: Payer: Self-pay

## 2020-11-18 ENCOUNTER — Emergency Department (HOSPITAL_BASED_OUTPATIENT_CLINIC_OR_DEPARTMENT_OTHER): Payer: Medicaid Other

## 2020-11-18 ENCOUNTER — Emergency Department (HOSPITAL_BASED_OUTPATIENT_CLINIC_OR_DEPARTMENT_OTHER)
Admission: EM | Admit: 2020-11-18 | Discharge: 2020-11-18 | Disposition: A | Discharge status: Home / Self Care | Payer: Medicaid Other | Attending: Emergency Medicine | Admitting: Emergency Medicine

## 2020-11-18 ENCOUNTER — Encounter (HOSPITAL_BASED_OUTPATIENT_CLINIC_OR_DEPARTMENT_OTHER): Payer: Self-pay | Admitting: Emergency Medicine

## 2020-11-18 DIAGNOSIS — J45909 Unspecified asthma, uncomplicated: Secondary | ICD-10-CM | POA: Insufficient documentation

## 2020-11-18 DIAGNOSIS — Z8616 Personal history of COVID-19: Secondary | ICD-10-CM | POA: Diagnosis not present

## 2020-11-18 DIAGNOSIS — J329 Chronic sinusitis, unspecified: Secondary | ICD-10-CM | POA: Insufficient documentation

## 2020-11-18 DIAGNOSIS — Z87891 Personal history of nicotine dependence: Secondary | ICD-10-CM | POA: Insufficient documentation

## 2020-11-18 DIAGNOSIS — R519 Headache, unspecified: Secondary | ICD-10-CM | POA: Diagnosis present

## 2020-11-18 HISTORY — DX: Unspecified asthma, uncomplicated: J45.909

## 2020-11-18 LAB — PREGNANCY, URINE: Preg Test, Ur: NEGATIVE

## 2020-11-18 MED ORDER — AMOXICILLIN-POT CLAVULANATE 875-125 MG PO TABS: 1.0000 | ORAL_TABLET | Freq: Two times a day (BID) | ORAL | 0 refills | Status: DC

## 2020-11-18 MED ORDER — ALBUTEROL SULFATE HFA 108 (90 BASE) MCG/ACT IN AERS: 1.0000 | INHALATION_SPRAY | Freq: Four times a day (QID) | RESPIRATORY_TRACT | 1 refills | Status: DC | PRN

## 2020-11-18 MED ORDER — FLUTICASONE PROPIONATE 50 MCG/ACT NA SUSP: 1.0000 | Freq: Every day | NASAL | 0 refills | Status: DC

## 2020-11-18 NOTE — ED Provider Notes (Signed)
[facility] EMERGENCY DEPARTMENT Provider Note   CSN: [id number] Arrival date & time: 11/18/20  1039     History Chief Complaint  Patient presents with  . Facial Pain    Katherine Padilla is a 29 y.o. female past medical history of asthma who presents for evaluation of nasal congestion, sinus pressure, facial pain, rhinorrhea, congestion.  She states that she has had some of the symptoms for about 2 years.  She reports that at the time, she moved in with her boyfriend and feels like she is allergic to her boyfriend's dog.  She states she will have sneezing, runny nose, congestion, sinus pressure.  She states sometimes she will take allergy medication but she has not been on any of it for several months.  She feels like the symptoms worsened in the last week.  She states she started having pressure around her face and jaw and then started having headaches.  She states she would have tension headaches on the top of her forehead.  She does not really know what brought on the headaches.  She states that they would be worse if she moves around.  She states that she felt lightheaded or dizzy sometimes when she walks.  At this time in the ED, she denies any dizziness, headache, lightheadedness.  She states she has not had any fevers, cough, nasal congestion.  She states she has a history of asthma and so has been she start using, she feels like that flares of her asthma.  She denies any chest pain, difficulty breathing.  The history is provided by the patient.       Past Medical History:  Diagnosis Date  . Asthma     Patient Active Problem List   Diagnosis Date Noted  . COVID-19 02/06/2020    Past Surgical History:  Procedure Laterality Date  . CESAREAN SECTION       OB History    Gravida  1   Para      Term      Preterm      AB      Living        SAB      TAB      Ectopic      Multiple      Live Births              No family history on file.  Social  History   Tobacco Use  . Smoking status: Former Games developer  . Smokeless tobacco: Never Used  Vaping Use  . Vaping Use: Never used  Substance Use Topics  . Alcohol use: Not Currently  . Drug use: Not Currently    Home Medications Prior to Admission medications   Medication Sig Start Date End Date Taking? Authorizing Provider  albuterol (VENTOLIN HFA) 108 (90 Base) MCG/ACT inhaler Inhale 1-2 puffs into the lungs every 6 (six) hours as needed for wheezing or shortness of breath. 11/18/20   Maxwell Caul, PA-C  amoxicillin-clavulanate (AUGMENTIN) 875-125 MG tablet Take 1 tablet by mouth every 12 (twelve) hours. 11/18/20   Maxwell Caul, PA-C  ferrous sulfate 325 (65 FE) MG tablet Take by mouth.    [provider]  fluticasone (FLONASE) 50 MCG/ACT nasal spray Place 1 spray into both nostrils daily. 11/18/20   Maxwell Caul, PA-C  naproxen (NAPROSYN) 250 MG tablet Take 1 tablet (250 mg total) by mouth 2 (two) times daily with a meal for 10 days. 11/14/20 11/24/20  Curatolo,  Adam, DO  Prenatal Vit-Fe Fumarate-FA (PRENATAL COMPLETE) 14-0.4 MG TABS Take 1 tablet by mouth daily. 08/21/18   Law, Waylan Boga, PA-C  sucralfate (CARAFATE) 1 g tablet Take 1 tablet (1 g total) by mouth 4 (four) times daily -  with meals and at bedtime for 14 days. 03/17/20 03/31/20  Cristina Gong, PA-C    Allergies    Patient has no known allergies.  Review of Systems   Review of Systems  Constitutional: Negative for fever.  HENT: Positive for congestion, rhinorrhea, sinus pressure, sinus pain and sneezing.   Respiratory: Negative for cough and shortness of breath.   Cardiovascular: Negative for chest pain.  Gastrointestinal: Negative for abdominal pain, nausea and vomiting.  Genitourinary: Negative for dysuria and hematuria.  Neurological: Positive for headaches. Negative for weakness and numbness.  All other systems reviewed and are negative.   Physical Exam Updated Vital Signs BP  139/79 (BP Location: Right Arm)   Pulse 72   Temp 98 F (36.7 C) (Oral)   Resp 16   Ht 5\' 2"  (1.575 m)   Wt 117.8 kg   LMP 11/14/2020   SpO2 100%   BMI 47.50 kg/m   Physical Exam Vitals and nursing note reviewed.  Constitutional:      Appearance: Normal appearance. She is well-developed.  HENT:     Head: Normocephalic and atraumatic.     Nose:     Right Turbinates: Enlarged.     Left Turbinates: Enlarged.     Right Sinus: Maxillary sinus tenderness and frontal sinus tenderness present.     Left Sinus: Maxillary sinus tenderness and frontal sinus tenderness present.     Mouth/Throat:     Comments: Posterior pharynx is clear, no erythema, edema.  Uvula is midline.  Airways patent, phonation is intact. Eyes:     General: Lids are normal.     Conjunctiva/sclera: Conjunctivae normal.     Pupils: Pupils are equal, round, and reactive to light.     Comments: PERRL. EOMs intact. No nystagmus. No neglect.   Cardiovascular:     Rate and Rhythm: Normal rate and regular rhythm.     Pulses: Normal pulses.     Heart sounds: Normal heart sounds. No murmur heard.  No friction rub. No gallop.   Pulmonary:     Effort: Pulmonary effort is normal.     Breath sounds: Normal breath sounds.  Musculoskeletal:        General: Normal range of motion.     Cervical back: Full passive range of motion without pain.  Skin:    General: Skin is warm and dry.     Capillary Refill: Capillary refill takes less than 2 seconds.  Neurological:     Mental Status: She is alert and oriented to person, place, and time.     Comments: Cranial nerves III-XII intact Follows commands, Moves all extremities  5/5 strength to BUE and BLE  Sensation intact throughout all major nerve distributions No gait abnormalities. No gait ataxia.  No slurred speech. No facial droop.   Psychiatric:        Speech: Speech normal.     ED Results / Procedures / Treatments   Labs (all labs ordered are listed, but only abnormal  results are displayed) Labs Reviewed  PREGNANCY, URINE    EKG None  Radiology CT Head Wo Contrast  Result Date: 11/18/2020 CLINICAL DATA:  Headache. EXAM: CT HEAD WITHOUT CONTRAST TECHNIQUE: Contiguous axial images were obtained from the base of the  skull through the vertex without intravenous contrast. COMPARISON:  None. FINDINGS: Brain: No evidence of acute infarction, hemorrhage, hydrocephalus, extra-axial collection or mass lesion/mass effect. Vascular: No hyperdense vessel or unexpected calcification. Skull: No acute fracture. Sinuses/Orbits: Mucosal thickening of all sinuses except the right frontal sinus. Partial opacification of the left frontal sinus. Other: No mastoid effusions. IMPRESSION: 1. No evidence of acute intracranial abnormality. 2. Nonspecific paranasal sinus disease. Correlate with the presence or absence of signs/symptoms of sinusitis. Electronically Signed   By: Feliberto Harts MD   On: 11/18/2020 11:38    Procedures Procedures (including critical care time)  Medications Ordered in ED Medications - No data to display  ED Course  I have reviewed the triage vital signs and the nursing notes.  Pertinent labs & imaging results that were available during my care of the patient were reviewed by me and considered in my medical decision making (see chart for details).    MDM Rules/Calculators/A&P                          29 year old female who presents for evaluation of nasal congestion, rhinorrhea, sneezing, sinus pressure.  States is been going going for about 2 years since she moved in with her boyfriend who is a Nurse, mental health.  She is concerned she may be allergic to the dog.  She feels like symptoms acutely worsened about 1 week ago.  She states she started getting headaches frequently.  She states prior to that, she was not really getting frequent headaches.  She states that they would also cause her to have facial pain and feel like she was dizzy with walking.  She states  she does not get frequent headaches.  She currently denies any headache or dizziness while here in the ED.  She has not noted any fever.  On initial arrival, she is afebrile, nontoxic-appearing.  Vital signs are stable.  On exam, she has some maxillary and frontal sinus pressure bilaterally.  Normal neuro exam.  She feels like she started getting headaches this week as well as this feeling of dizziness, lightheadedness.  She currently denies of symptoms here in the ED and she has a completely normal neuro exam here in the ED, including no signs of gait ataxia.  My suspicion for intracranial mass that could be contributing to her symptoms or that would cause space-occupying lesion is very low but we did discuss this is a possibility given new onset headaches with dizziness/lightheadedness appearance.  I also discussed with her that given the duration of her symptoms, it is most likely due to persistent allergies.  We discussed at length regarding work-up here in the ED.  We discussed obtaining a CT head to rule out any intracranial mass or space-occupying lesion.  After extensive discussion, patient opted to obtain CT scan which I feel is reasonable given new onset headaches.  CT head shows no evidence of acute abnormalities.  She does have paranasal sinus disease.    Discussed results with patient.  We will plan to put her on medications for sinusitis.  Encouraged at home supportive care measures.  We will give her outpatient primary care referral for further allergy testing. At this time, patient exhibits no emergent life-threatening condition that require further evaluation in ED. Patient had ample opportunity for questions and discussion. All patient's questions were answered with full understanding. Strict return precautions discussed. Patient expresses understanding and agreement to plan.   Portions of this note were  generated with Scientist, clinical (histocompatibility and immunogenetics)Dragon dictation software. Dictation errors may occur despite best  attempts at proofreading.  Final Clinical Impression(s) / ED Diagnoses Final diagnoses:  Sinusitis, unspecified chronicity, unspecified location    Rx / DC Orders ED Discharge Orders         Ordered    albuterol (VENTOLIN HFA) 108 (90 Base) MCG/ACT inhaler  Every 6 hours PRN        11/18/20 1201    amoxicillin-clavulanate (AUGMENTIN) 875-125 MG tablet  Every 12 hours        11/18/20 1201    fluticasone (FLONASE) 50 MCG/ACT nasal spray  Daily        11/18/20 1201           Maxwell CaulLayden, Marianela Mandrell A, PA-C 11/18/20 1457    Derwood KaplanNanavati, Ankit, MD 11/19/20 [phone]

## 2020-11-18 NOTE — ED Triage Notes (Signed)
Patient c/o possible sinus issues. Patient c/o jaw pain, pressure around nose, tension headache on forehead and intermittent dizziness x 1 week.

## 2020-11-18 NOTE — Discharge Instructions (Signed)
As we discussed, some of your symptoms that have been ongoing for years may be due to allergies.  May be beneficial to restart on allergy medication such as Zyrtec, Allegra, Claritin.  Additionally, as we discussed, your CT scan did show some sinus disease we will plan to treat it with antibiotics as well as Flonase spray.  I have also provided you a refill inhaler.  It is important that you obtain a primary care doctor see get further allergy testing.  Return the emergency department for any difficulty breathing, chest pain, vomiting, difficulty moving arms or legs or any other worsening concerning symptoms.

## 2021-01-14 ENCOUNTER — Other Ambulatory Visit: Payer: Self-pay

## 2021-01-14 ENCOUNTER — Other Ambulatory Visit (HOSPITAL_BASED_OUTPATIENT_CLINIC_OR_DEPARTMENT_OTHER): Payer: Self-pay | Admitting: Physician Assistant

## 2021-01-14 ENCOUNTER — Emergency Department (HOSPITAL_BASED_OUTPATIENT_CLINIC_OR_DEPARTMENT_OTHER)
Admission: EM | Admit: 2021-01-14 | Discharge: 2021-01-14 | Disposition: A | Discharge status: Home / Self Care | Payer: Medicaid Other | Attending: Emergency Medicine | Admitting: Emergency Medicine

## 2021-01-14 ENCOUNTER — Emergency Department (HOSPITAL_BASED_OUTPATIENT_CLINIC_OR_DEPARTMENT_OTHER): Payer: Medicaid Other

## 2021-01-14 ENCOUNTER — Encounter (HOSPITAL_BASED_OUTPATIENT_CLINIC_OR_DEPARTMENT_OTHER): Payer: Self-pay | Admitting: Emergency Medicine

## 2021-01-14 DIAGNOSIS — Z8616 Personal history of COVID-19: Secondary | ICD-10-CM | POA: Diagnosis not present

## 2021-01-14 DIAGNOSIS — Y990 Civilian activity done for income or pay: Secondary | ICD-10-CM | POA: Diagnosis not present

## 2021-01-14 DIAGNOSIS — S4991XA Unspecified injury of right shoulder and upper arm, initial encounter: Secondary | ICD-10-CM | POA: Insufficient documentation

## 2021-01-14 DIAGNOSIS — Z87891 Personal history of nicotine dependence: Secondary | ICD-10-CM | POA: Diagnosis not present

## 2021-01-14 DIAGNOSIS — Z7951 Long term (current) use of inhaled steroids: Secondary | ICD-10-CM | POA: Insufficient documentation

## 2021-01-14 DIAGNOSIS — J45909 Unspecified asthma, uncomplicated: Secondary | ICD-10-CM | POA: Diagnosis not present

## 2021-01-14 DIAGNOSIS — Z91040 Latex allergy status: Secondary | ICD-10-CM | POA: Insufficient documentation

## 2021-01-14 DIAGNOSIS — X503XXA Overexertion from repetitive movements, initial encounter: Secondary | ICD-10-CM | POA: Diagnosis not present

## 2021-01-14 MED ORDER — METHOCARBAMOL 500 MG PO TABS: 500.0000 mg | ORAL_TABLET | Freq: Two times a day (BID) | ORAL | 0 refills | Status: DC

## 2021-01-14 MED ORDER — NAPROXEN 375 MG PO TABS: 375.0000 mg | ORAL_TABLET | Freq: Two times a day (BID) | ORAL | 0 refills | Status: DC

## 2021-01-14 MED FILL — NAPROXEN 375 MG TABLET: 375 | 7 days supply | Qty: 14 | Fill #0

## 2021-01-14 MED FILL — METHOCARBAMOL 500 MG TABLET: 500 | 7 days supply | Qty: 14 | Fill #0

## 2021-01-14 NOTE — ED Triage Notes (Signed)
Pt having right shoulder pain since Saturday.  Pt states she had to exert on Friday at work, moving totes filled with heavy items repetitively.  Pt states it continues to be painful.

## 2021-01-14 NOTE — Discharge Instructions (Signed)
I have prescribed muscle relaxers for your pain, please do not drink or drive while taking this medications as they can make you drowsy.    You were provided with a note for work.  You will need to continue ice, heat in order to help with your pain.

## 2021-01-14 NOTE — ED Notes (Signed)
Pt returned to room (from XR). 

## 2021-01-14 NOTE — ED Provider Notes (Signed)
[facility] EMERGENCY DEPARTMENT Provider Note   CSN: [id number] Arrival date & time: 01/14/21  0844     History Chief Complaint  Patient presents with  . Shoulder Injury    Katherine Padilla is a 30 y.o. female.  30 y.o female with a PMH presents to the ED with a chief complaint of right shoulder pain for the past 3 days.  Reports she was given a new task at work due to short staffing, reports injuring her right shoulder with repetitive movement of picking up heavy objects and bringing them onto a table.  Pain is exacerbated with movement, has tried muscle relaxers one time without improvement, has tried rolling cream without improvement.  No other complaints.  No chest pain, shortness of breath, tingling to the arm.  The history is provided by the patient.  Shoulder Pain Location:  Shoulder Shoulder location:  R shoulder Injury: no   Pain details:    Quality:  Aching   Radiates to:  R forearm   Severity:  Moderate   Onset quality:  Sudden   Duration:  3 days   Timing:  Constant   Progression:  Waxing and waning Handedness:  Right-handed Dislocation: no   Tetanus status:  Unknown Prior injury to area:  No Relieved by:  Nothing Worsened by:  Movement Ineffective treatments:  Immobilization, muscle relaxant and rest Associated symptoms: no back pain, no fever, no muscle weakness, no numbness, no stiffness and no tingling        Past Medical History:  Diagnosis Date  . Asthma     Patient Active Problem List   Diagnosis Date Noted  . COVID-19 02/06/2020    Past Surgical History:  Procedure Laterality Date  . CESAREAN SECTION       OB History    Gravida  1   Para      Term      Preterm      AB      Living        SAB      IAB      Ectopic      Multiple      Live Births              No family history on file.  Social History   Tobacco Use  . Smoking status: Former Games developer  . Smokeless tobacco: Never Used  Vaping Use  .  Vaping Use: Never used  Substance Use Topics  . Alcohol use: Not Currently  . Drug use: Not Currently    Home Medications Prior to Admission medications   Medication Sig Start Date End Date Taking? Authorizing Provider  albuterol (VENTOLIN HFA) 108 (90 Base) MCG/ACT inhaler Inhale 1-2 puffs into the lungs every 6 (six) hours as needed for wheezing or shortness of breath. 11/18/20  Yes Maxwell Caul, PA-C  fluticasone (FLONASE) 50 MCG/ACT nasal spray Place 1 spray into both nostrils daily. 11/18/20  Yes Maxwell Caul, PA-C  methocarbamol (ROBAXIN) 500 MG tablet Take 1 tablet (500 mg total) by mouth 2 (two) times daily for 7 days. 01/14/21 01/21/21 Yes Monty Mccarrell, PA-C  naproxen (NAPROSYN) 375 MG tablet Take 1 tablet (375 mg total) by mouth 2 (two) times daily for 7 days. 01/14/21 01/21/21 Yes Anasophia Pecor, Leonie Douglas, PA-C  amoxicillin-clavulanate (AUGMENTIN) 875-125 MG tablet Take 1 tablet by mouth every 12 (twelve) hours. 11/18/20   Maxwell Caul, PA-C  ferrous sulfate 325 (65 FE) MG tablet Take by mouth.  [provider]  Prenatal Vit-Fe Fumarate-FA (PRENATAL COMPLETE) 14-0.4 MG TABS Take 1 tablet by mouth daily. 08/21/18   Law, Waylan Boga, PA-C  sucralfate (CARAFATE) 1 g tablet Take 1 tablet (1 g total) by mouth 4 (four) times daily -  with meals and at bedtime for 14 days. 03/17/20 03/31/20  Cristina Gong, PA-C    Allergies    Latex  Review of Systems   Review of Systems  Constitutional: Negative for fever.  Musculoskeletal: Negative for back pain and stiffness.    Physical Exam Updated Vital Signs BP (!) 141/77   Pulse 96   Temp 98.2 F (36.8 C) (Oral)   Resp 16   Ht 5\' 2"  (1.575 m)   Wt 115.7 kg   LMP 01/13/2021   SpO2 100%   BMI 46.64 kg/m   Physical Exam Vitals and nursing note reviewed.  Constitutional:      Appearance: Normal appearance. She is obese. She is not ill-appearing.  HENT:     Mouth/Throat:     Mouth: Mucous membranes are moist.   Neck:      Comments: Pain with palpation on the right scapula and neck reproducible with palpation and movement of the right shoulder.  Abdominal:     General: Abdomen is flat.  Musculoskeletal:        General: Tenderness present.     Right shoulder: Tenderness present. No swelling, deformity, effusion, laceration, bony tenderness or crepitus. Normal range of motion. Normal strength. Normal pulse.     Cervical back: Normal range of motion and neck supple. No rigidity.     Comments: Pulses present, sensation is intact. No bony tenderness.   Skin:    General: Skin is warm and dry.  Neurological:     Mental Status: She is alert and oriented to person, place, and time.     ED Results / Procedures / Treatments   Labs (all labs ordered are listed, but only abnormal results are displayed) Labs Reviewed - No data to display  EKG None  Radiology DG Shoulder Right  Result Date: 01/14/2021 CLINICAL DATA:  Pain EXAM: RIGHT SHOULDER - 2+ VIEW COMPARISON:  None. FINDINGS: Oblique, Y scapular, and axillary images obtained. No fracture or dislocation. Joint spaces appear normal. No erosive change. Visualized right lung clear. IMPRESSION: No fracture or dislocation.  No evident arthropathy. Electronically Signed   By: 01/16/2021 III M.D.   On: 01/14/2021 09:31    Procedures Procedures (including critical care time)  Medications Ordered in ED Medications - No data to display  ED Course  I have reviewed the triage vital signs and the nursing notes.  Pertinent labs & imaging results that were available during my care of the patient were reviewed by me and considered in my medical decision making (see chart for details).    MDM Rules/Calculators/A&P    Patient arrives to the ED today with a chief complaint of right shoulder pain, this occurred 3 days ago while at work, as she was at work lifting heavy crates onto a table.  She reports there is pain to the area, exacerbated with  movement.  No injury or fall.  During evaluation she is neurovascularly intact.  Does have full range of motion of the right shoulder with pain.  Pulses are intact, sensation is intact throughout.  No changes in her skin, no tenderness.  She has tried over-the-counter measurements however for 1 day only.  We discussed treatment with muscle relaxers along with rice  therapy.  She is agreeable to this at this time and is requesting note to return to work.  Vitals are within normal limits.  She denies any chest pain, shortness of breath, prior history of cardiac disease.  Patient stable for discharge, return precautions discussed at length. Portions o this note were generated with Scientist, clinical (histocompatibility and immunogenetics). Dictation errors may occur despite best attempts at proofreading.  Final Clinical Impression(s) / ED Diagnoses Final diagnoses:  Injury of right shoulder, initial encounter    Rx / DC Orders ED Discharge Orders         Ordered    methocarbamol (ROBAXIN) 500 MG tablet  2 times daily        01/14/21 1025    naproxen (NAPROSYN) 375 MG tablet  2 times daily        01/14/21 [address] PA-C 01/14/21 1027    Virgina Norfolk, DO 01/14/21 1247

## 2021-10-11 ENCOUNTER — Emergency Department (HOSPITAL_BASED_OUTPATIENT_CLINIC_OR_DEPARTMENT_OTHER)
Admission: EM | Admit: 2021-10-11 | Discharge: 2021-10-11 | Disposition: A | Discharge status: Home / Self Care | Payer: Medicaid Other | Attending: Emergency Medicine | Admitting: Emergency Medicine

## 2021-10-11 ENCOUNTER — Other Ambulatory Visit: Payer: Self-pay

## 2021-10-11 ENCOUNTER — Encounter (HOSPITAL_BASED_OUTPATIENT_CLINIC_OR_DEPARTMENT_OTHER): Payer: Self-pay

## 2021-10-11 ENCOUNTER — Other Ambulatory Visit (HOSPITAL_BASED_OUTPATIENT_CLINIC_OR_DEPARTMENT_OTHER): Payer: Self-pay

## 2021-10-11 DIAGNOSIS — Z87891 Personal history of nicotine dependence: Secondary | ICD-10-CM | POA: Insufficient documentation

## 2021-10-11 DIAGNOSIS — Z91040 Latex allergy status: Secondary | ICD-10-CM | POA: Insufficient documentation

## 2021-10-11 DIAGNOSIS — J029 Acute pharyngitis, unspecified: Secondary | ICD-10-CM | POA: Diagnosis not present

## 2021-10-11 DIAGNOSIS — Z7952 Long term (current) use of systemic steroids: Secondary | ICD-10-CM | POA: Diagnosis not present

## 2021-10-11 DIAGNOSIS — Z8616 Personal history of COVID-19: Secondary | ICD-10-CM | POA: Insufficient documentation

## 2021-10-11 DIAGNOSIS — J45909 Unspecified asthma, uncomplicated: Secondary | ICD-10-CM | POA: Insufficient documentation

## 2021-10-11 DIAGNOSIS — M7918 Myalgia, other site: Secondary | ICD-10-CM | POA: Insufficient documentation

## 2021-10-11 DIAGNOSIS — Z20822 Contact with and (suspected) exposure to covid-19: Secondary | ICD-10-CM | POA: Insufficient documentation

## 2021-10-11 LAB — RESP PANEL BY RT-PCR (FLU A&B, COVID) ARPGX2
Influenza A by PCR: NEGATIVE
Influenza B by PCR: NEGATIVE
SARS Coronavirus 2 by RT PCR: NEGATIVE

## 2021-10-11 LAB — GROUP A STREP BY PCR: Group A Strep by PCR: NOT DETECTED

## 2021-10-11 MED ORDER — ACETAMINOPHEN 325 MG PO TABS
650.0000 mg | ORAL_TABLET | Freq: Four times a day (QID) | ORAL | 0 refills | Status: DC | PRN
  Filled 2021-10-11: qty 50, 7d supply, fill #0

## 2021-10-11 MED ORDER — LIDOCAINE VISCOUS HCL 2 % MT SOLN
15.0000 mL | Freq: Once | OROMUCOSAL | Status: AC
  Administered 2021-10-11: 15 mL via OROMUCOSAL
  Filled 2021-10-11: qty 15

## 2021-10-11 MED ORDER — LIDOCAINE VISCOUS HCL 2 % MT SOLN
15.0000 mL | OROMUCOSAL | 0 refills | Status: AC | PRN
  Filled 2021-10-11: qty 100, 5d supply, fill #0

## 2021-10-11 MED ORDER — CETIRIZINE HCL 10 MG PO TABS
10.0000 mg | ORAL_TABLET | Freq: Every day | ORAL | 3 refills | Status: DC
  Filled 2021-10-11: qty 100, 100d supply, fill #0

## 2021-10-11 MED ORDER — FLUTICASONE PROPIONATE 50 MCG/ACT NA SUSP
1.0000 | Freq: Every day | NASAL | 0 refills | Status: AC
  Filled 2021-10-11: qty 16, 60d supply, fill #0

## 2021-10-11 MED ORDER — ACETAMINOPHEN 325 MG PO TABS
650.0000 mg | ORAL_TABLET | Freq: Once | ORAL | Status: AC
  Administered 2021-10-11: 650 mg via ORAL
  Filled 2021-10-11: qty 2

## 2021-10-11 NOTE — ED Triage Notes (Signed)
Pt c/o sore throat and generalized body aches for three days. Pt states her daughter has also been sick at home. Denies fever, N/V/D. NAD during triage.

## 2021-10-11 NOTE — ED Provider Notes (Signed)
[facility] EMERGENCY DEPARTMENT Provider Note   CSN: [id number] Arrival date & time: 10/11/21  0725     History Chief Complaint  Patient presents with   Generalized Body Aches    Katherine Padilla is a 30 y.o. female.  This is a 30 y.o. female with significant medical history as below, including tonsillitis, asthma who presents to the ED with complaint of sore throat  Location:  throat b/l Duration:  3-4 days Onset:  gradual Timing:  constant, worsening Description:  scratchiness, aching sensation to posterior oropharynx Severity:  mild Exacerbating/Alleviating Factors:  mildly improved with cepachol lozenge Associated Symptoms:  cough, mild nausea, sinus congestion/ post nasal drip that she a/w seasonal allergies Pertinent Negatives:  no fevers, chills, emesis, no voice changes, no mouth/lip swelling, no pain with neck movement, no jaw pain Context: multiple days of progressive sore throat, daughter at home also with similar s/s. She feels she is speaking normally, noticed pain when eating breakfast this morning when swallowing. No drooling.    The history is provided by the patient. No language interpreter was used.      Past Medical History:  Diagnosis Date   Asthma     Patient Active Problem List   Diagnosis Date Noted   COVID-19 02/06/2020    Past Surgical History:  Procedure Laterality Date   CESAREAN SECTION       OB History     Gravida  1   Para      Term      Preterm      AB      Living         SAB      IAB      Ectopic      Multiple      Live Births              No family history on file.  Social History   Tobacco Use   Smoking status: Former   Smokeless tobacco: Never  Building services engineer Use: Never used  Substance Use Topics   Alcohol use: Not Currently   Drug use: Not Currently    Home Medications Prior to Admission medications   Medication Sig Start Date End Date Taking? Authorizing Provider   acetaminophen (TYLENOL) 325 MG tablet Take 2 tablets (650 mg total) by mouth every 6 (six) hours as needed for up to 50 doses. 10/11/21  Yes Sloan Leiter, DO  cetirizine (ZYRTEC ALLERGY) 10 MG tablet Take 1 tablet (10 mg total) by mouth daily. 10/11/21 01/19/22 Yes Tanda Rockers A, DO  fluticasone (FLONASE) 50 MCG/ACT nasal spray Place 1 spray into both nostrils daily. 10/11/21  Yes Tanda Rockers A, DO  lidocaine (XYLOCAINE) 2 % solution Use as directed 15 mLs in the mouth or throat as needed for up to 5 days for mouth pain. 10/11/21 10/16/21 Yes Tanda Rockers A, DO  albuterol (VENTOLIN HFA) 108 (90 Base) MCG/ACT inhaler Inhale 1-2 puffs into the lungs every 6 (six) hours as needed for wheezing or shortness of breath. 11/18/20   Maxwell Caul, PA-C  amoxicillin-clavulanate (AUGMENTIN) 875-125 MG tablet Take 1 tablet by mouth every 12 (twelve) hours. 11/18/20   Maxwell Caul, PA-C  ferrous sulfate 325 (65 FE) MG tablet Take by mouth.    [provider]  fluticasone (FLONASE) 50 MCG/ACT nasal spray Place 1 spray into both nostrils daily. 11/18/20   Maxwell Caul, PA-C  methocarbamol (ROBAXIN) 500 MG tablet TAKE  1 TABLET BY MOUTH TWICE DAILY FOR 7 DAYS 01/14/21 01/14/22  Claude Manges, PA-C  naproxen (NAPROSYN) 375 MG tablet TAKE 1 TABLET (375 MG TOTAL) BY MOUTH 2 (TWO) TIMES DAILY FOR 7 DAYS. 01/14/21 01/14/22  Claude Manges, PA-C  naproxen (NAPROSYN) 500 MG tablet TAKE 1/2 TABLET (250 MG TOTAL) BY MOUTH 2 (TWO) TIMES DAILY WITH A MEAL FOR 10 DAYS. 11/14/20 11/14/21  Virgina Norfolk, DO  Prenatal Vit-Fe Fumarate-FA (PRENATAL COMPLETE) 14-0.4 MG TABS Take 1 tablet by mouth daily. 08/21/18   Law, Waylan Boga, PA-C  sucralfate (CARAFATE) 1 g tablet Take 1 tablet (1 g total) by mouth 4 (four) times daily -  with meals and at bedtime for 14 days. 03/17/20 03/31/20  Cristina Gong, PA-C    Allergies    Latex  Review of Systems   Review of Systems  Constitutional:  Negative for chills and  fever.  HENT:  Positive for congestion, postnasal drip, rhinorrhea, sore throat and trouble swallowing. Negative for dental problem, drooling, facial swelling, tinnitus and voice change.   Eyes:  Negative for photophobia and visual disturbance.  Respiratory:  Negative for cough and shortness of breath.   Cardiovascular:  Negative for chest pain and palpitations.  Gastrointestinal:  Negative for abdominal pain, nausea and vomiting.  Endocrine: Negative for polydipsia and polyuria.  Genitourinary:  Negative for difficulty urinating and hematuria.  Musculoskeletal:  Negative for gait problem and joint swelling.  Skin:  Negative for pallor and rash.  Neurological:  Negative for syncope and headaches.  Psychiatric/Behavioral:  Negative for agitation and confusion.    Physical Exam Updated Vital Signs BP 122/70   Pulse 89   Temp 99.6 F (37.6 C) (Oral)   Resp 14   Ht 5\' 2"  (1.575 m)   Wt 115.2 kg   SpO2 100%   BMI 46.46 kg/m   Physical Exam Vitals and nursing note reviewed.  Constitutional:      General: She is not in acute distress.    Appearance: Normal appearance.  HENT:     Head: Normocephalic and atraumatic.     Jaw: There is normal jaw occlusion. No trismus.     Right Ear: External ear normal.     Left Ear: External ear normal.     Nose: Nose normal.     Mouth/Throat:     Mouth: Mucous membranes are moist.     Dentition: Normal dentition.     Pharynx: Uvula midline. Posterior oropharyngeal erythema present. No oropharyngeal exudate or uvula swelling.     Tonsils: No tonsillar exudate or tonsillar abscesses.     Comments: Cobblestoning  Eyes:     General: No scleral icterus.       Right eye: No discharge.        Left eye: No discharge.  Neck:     Comments: No stridor  Cardiovascular:     Rate and Rhythm: Normal rate and regular rhythm.     Pulses: Normal pulses.     Heart sounds: Normal heart sounds.  Pulmonary:     Effort: Pulmonary effort is normal. No  respiratory distress.     Breath sounds: Normal breath sounds.  Abdominal:     General: Abdomen is flat.     Tenderness: There is no abdominal tenderness.  Musculoskeletal:        General: Normal range of motion.     Cervical back: Full passive range of motion without pain and normal range of motion.     Right lower  leg: No edema.     Left lower leg: No edema.  Skin:    General: Skin is warm and dry.     Capillary Refill: Capillary refill takes less than 2 seconds.  Neurological:     Mental Status: She is alert.  Psychiatric:        Mood and Affect: Mood normal.        Behavior: Behavior normal.    ED Results / Procedures / Treatments   Labs (all labs ordered are listed, but only abnormal results are displayed) Labs Reviewed  RESP PANEL BY RT-PCR (FLU A&B, COVID) ARPGX2  GROUP A STREP BY PCR    EKG None  Radiology No results found.  Procedures Procedures   Medications Ordered in ED Medications  lidocaine (XYLOCAINE) 2 % viscous mouth solution 15 mL (15 mLs Mouth/Throat Given 10/11/21 0846)  acetaminophen (TYLENOL) tablet 650 mg (650 mg Oral Given 10/11/21 0845)    ED Course  I have reviewed the triage vital signs and the nursing notes.  Pertinent labs & imaging results that were available during my care of the patient were reviewed by me and considered in my medical decision making (see chart for details).    MDM Rules/Calculators/A&P                            CC: sore throat  This patient complains of sore throat; this involves an extensive number of treatment options and is a complaint that carries with it a high risk of complications and morbidity. Vital signs were reviewed. Serious etiologies considered.  Record review:   Previous records obtained and reviewed   Work up as above, notable for:   Labs & imaging results that were available during my care of the patient were reviewed by me and considered in my medical decision making.    Management: Supportive and symptomatic care  Reassessment:  She is tolerating PO without difficulty, Strep and FLU/C19 panel negative. Very low suspicion for deep space infection. Physical exam is re-assuring, no drooling stridor or trismus. Pt with likely pharyngitis, discussed home supportive care and strict return precautions.  The patient improved significantly and was discharged in stable condition. Detailed discussions were had with the patient regarding current findings, and need for close f/u with PCP or on call doctor. The patient has been instructed to return immediately if the symptoms worsen in any way for re-evaluation. Patient verbalized understanding and is in agreement with current care plan. All questions answered prior to discharge.           This chart was dictated using voice recognition software.  Despite best efforts to proofread,  errors can occur which can change the documentation meaning.  Final Clinical Impression(s) / ED Diagnoses Final diagnoses:  Sore throat  Pharyngitis, unspecified etiology    Rx / DC Orders ED Discharge Orders          Ordered    lidocaine (XYLOCAINE) 2 % solution  As needed        10/11/21 0937    acetaminophen (TYLENOL) 325 MG tablet  Every 6 hours PRN        10/11/21 0937    cetirizine (ZYRTEC ALLERGY) 10 MG tablet  Daily        10/11/21 0937    fluticasone (FLONASE) 50 MCG/ACT nasal spray  Daily        10/11/21 0937  Sloan Leiter, DO 10/12/21 1243

## 2021-10-11 NOTE — Discharge Instructions (Addendum)
Please follow up with PCP, if you develop difficulty with breathing, inability to swallow food, inability to swallow your own secretions/saliva, worsening/worrisome symptoms please return to ED.

## 2022-02-07 IMAGING — DX DG SHOULDER 2+V*R*
3 series · 3 of 3 positions shown · non-contrast
Comparison: None.

CLINICAL DATA: Pain

EXAM:
RIGHT SHOULDER - 2+ VIEW

[shoulder grashey]
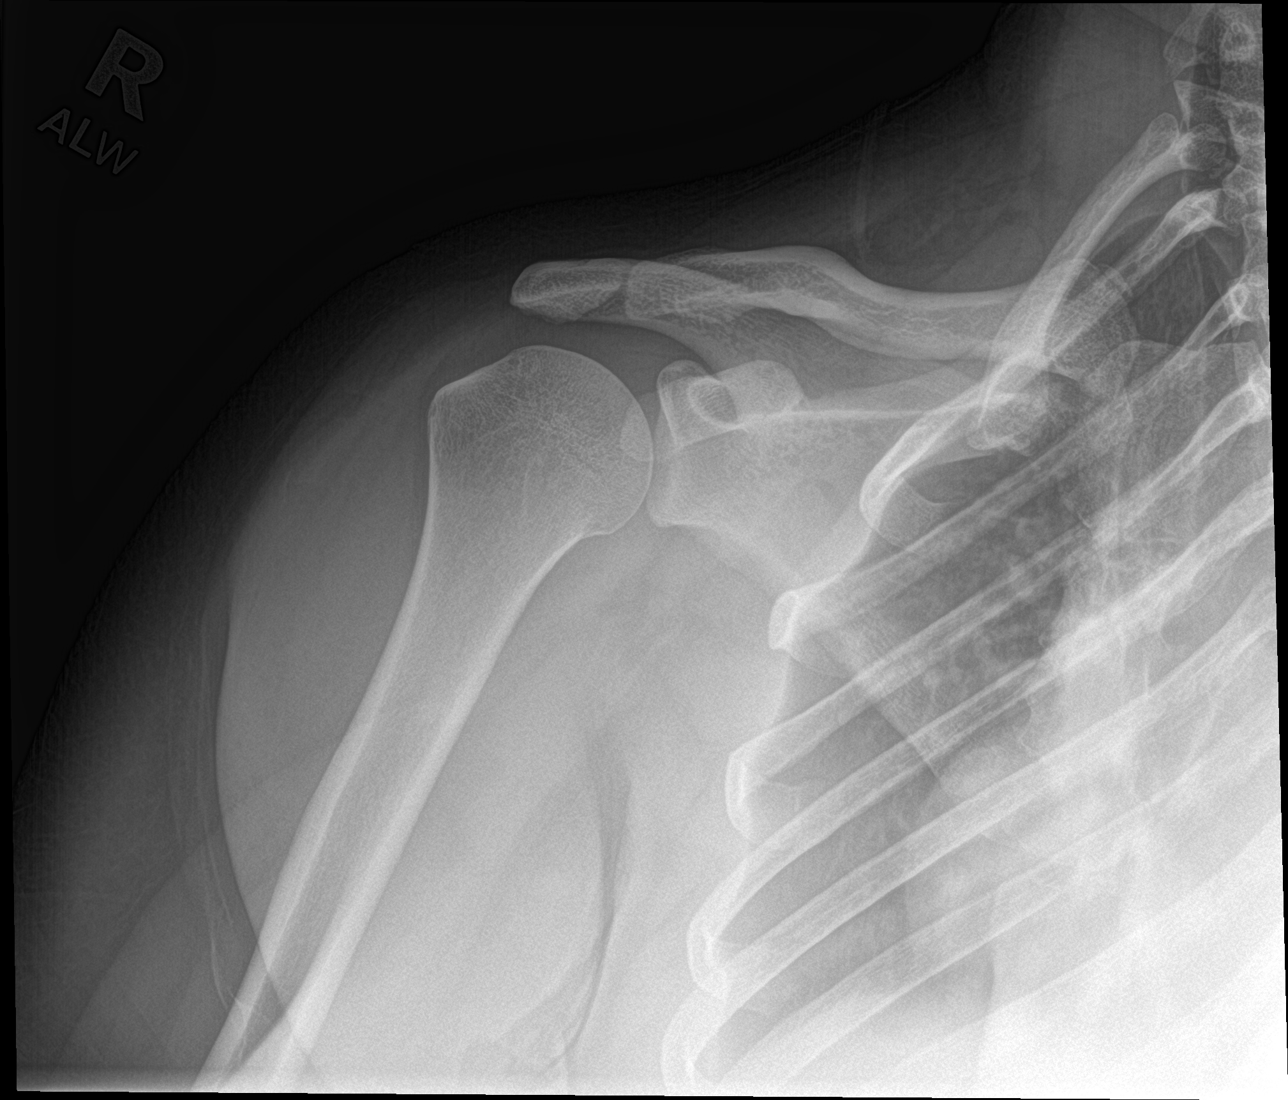

[shoulder y view]
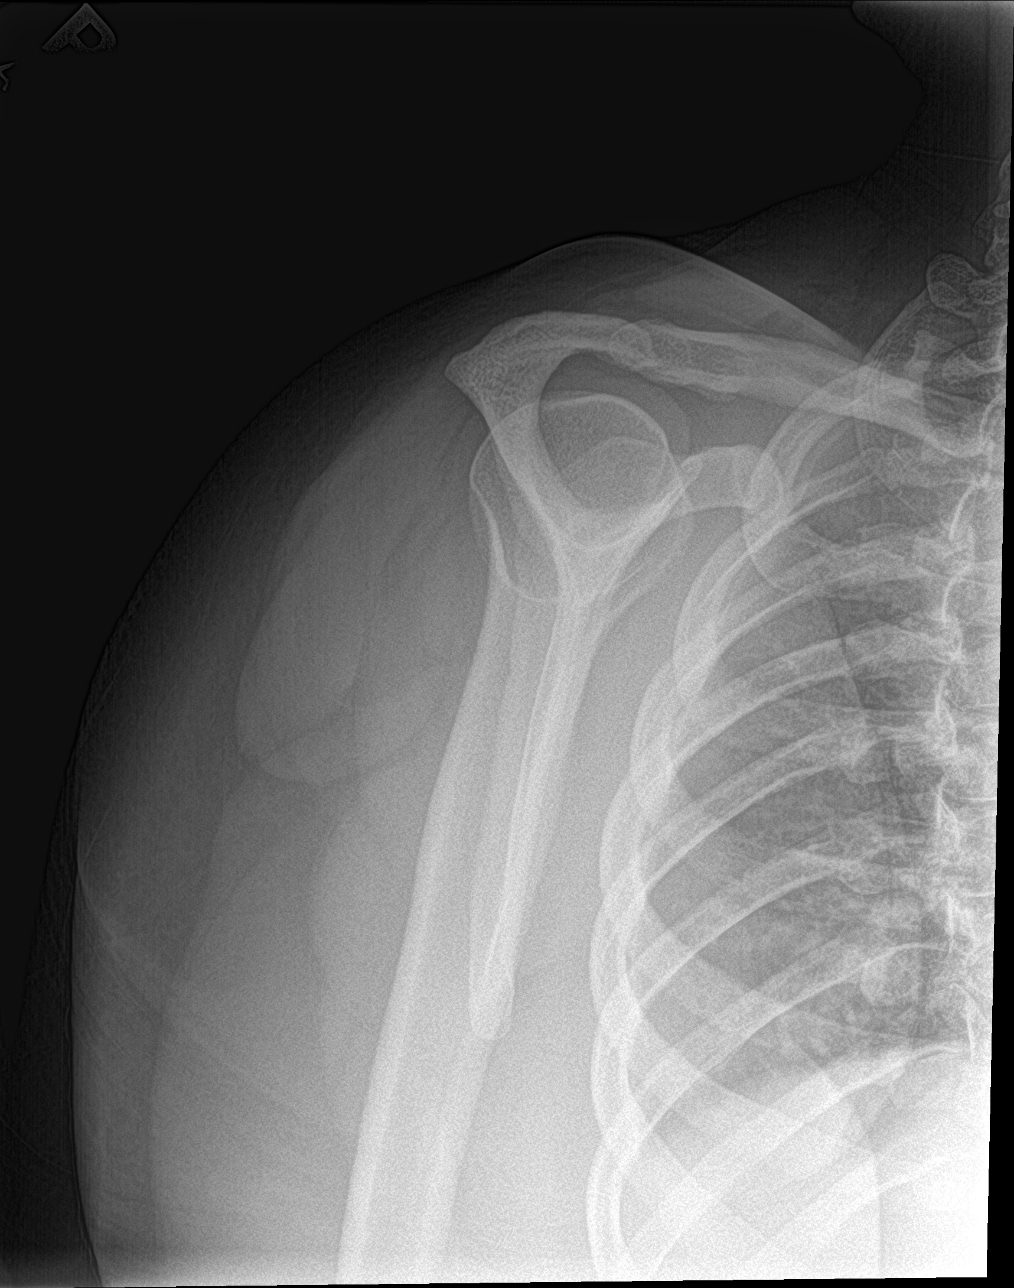

[shoulder axillary]
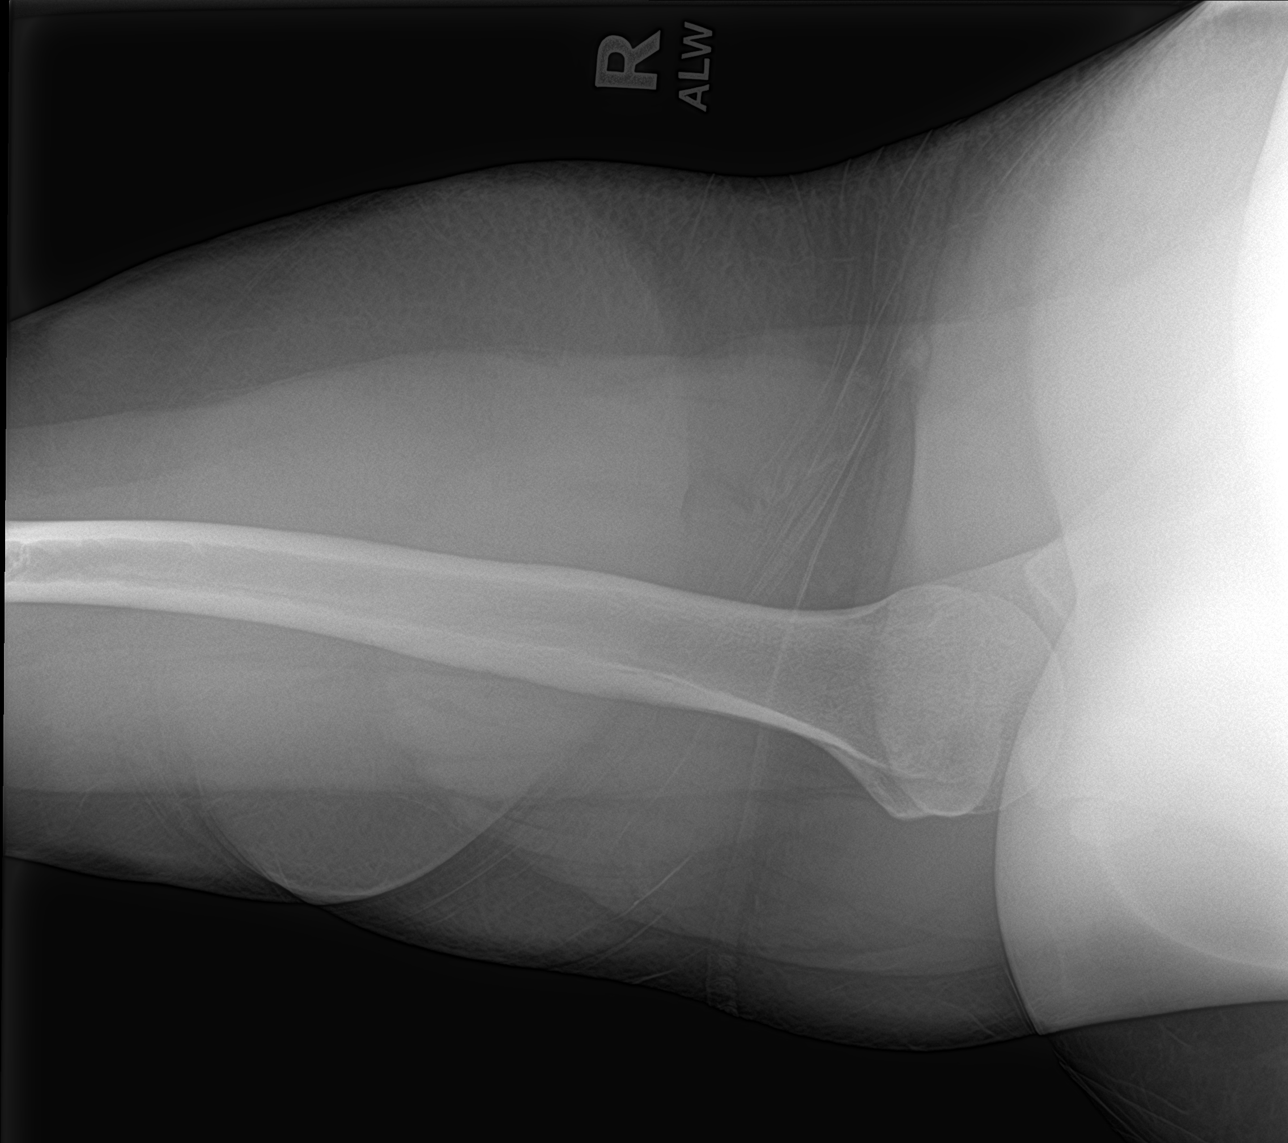

[3 of 3 positions shown; findings below may reference images not displayed]

FINDINGS: Oblique, Y scapular, and axillary images obtained. No fracture or
dislocation. Joint spaces appear normal. No erosive change.
Visualized right lung clear.
IMPRESSION: No fracture or dislocation.  No evident arthropathy.

## 2022-03-20 ENCOUNTER — Other Ambulatory Visit: Payer: Self-pay

## 2022-03-20 ENCOUNTER — Other Ambulatory Visit (HOSPITAL_BASED_OUTPATIENT_CLINIC_OR_DEPARTMENT_OTHER): Payer: Self-pay

## 2022-03-20 ENCOUNTER — Emergency Department (HOSPITAL_BASED_OUTPATIENT_CLINIC_OR_DEPARTMENT_OTHER)
Admission: EM | Admit: 2022-03-20 | Discharge: 2022-03-20 | Disposition: A | Discharge status: Home / Self Care | Payer: Medicaid Other | Attending: Emergency Medicine | Admitting: Emergency Medicine

## 2022-03-20 ENCOUNTER — Encounter (HOSPITAL_BASED_OUTPATIENT_CLINIC_OR_DEPARTMENT_OTHER): Payer: Self-pay | Admitting: Emergency Medicine

## 2022-03-20 DIAGNOSIS — H5789 Other specified disorders of eye and adnexa: Secondary | ICD-10-CM

## 2022-03-20 DIAGNOSIS — Z91040 Latex allergy status: Secondary | ICD-10-CM | POA: Diagnosis not present

## 2022-03-20 DIAGNOSIS — H5711 Ocular pain, right eye: Secondary | ICD-10-CM

## 2022-03-20 DIAGNOSIS — L03213 Periorbital cellulitis: Secondary | ICD-10-CM | POA: Diagnosis not present

## 2022-03-20 MED ORDER — CEPHALEXIN 250 MG PO CAPS
500.0000 mg | ORAL_CAPSULE | Freq: Once | ORAL | Status: AC
  Administered 2022-03-20: 500 mg via ORAL
  Filled 2022-03-20: qty 2

## 2022-03-20 MED ORDER — ERYTHROMYCIN 5 MG/GM OP OINT
TOPICAL_OINTMENT | OPHTHALMIC | 0 refills | Status: AC
  Filled 2022-03-20: qty 3.5, 7d supply, fill #0

## 2022-03-20 MED ORDER — CEPHALEXIN 500 MG PO CAPS
500.0000 mg | ORAL_CAPSULE | Freq: Two times a day (BID) | ORAL | 0 refills | Status: AC
  Filled 2022-03-20: qty 14, 7d supply, fill #0

## 2022-03-20 MED ORDER — TETRACAINE HCL 0.5 % OP SOLN
2.0000 [drp] | Freq: Once | OPHTHALMIC | Status: AC
  Administered 2022-03-20: 2 [drp] via OPHTHALMIC
  Filled 2022-03-20: qty 4

## 2022-03-20 MED ORDER — FLUORESCEIN SODIUM 1 MG OP STRP
1.0000 | ORAL_STRIP | Freq: Once | OPHTHALMIC | Status: AC
  Administered 2022-03-20: 1 via OPHTHALMIC
  Filled 2022-03-20: qty 1

## 2022-03-20 MED ORDER — ERYTHROMYCIN 5 MG/GM OP OINT
TOPICAL_OINTMENT | Freq: Once | OPHTHALMIC | Status: AC
  Filled 2022-03-20: qty 3.5

## 2022-03-20 NOTE — Discharge Instructions (Addendum)
?  You were diagnosed with iritis or keratitis of the right eye, which is inflammation of the eye.  I prescribed you an antibiotic ointment which you apply 4 times a day for 7 days.  Remember to wash your hands before and after applying this ointment on the right eyelid. ? ?I also prescribed you an antibiotic called Keflex which may treat for possible skin infection around your eye, called preseptal cellulitis.  Please take this twice a day for the full 7 days. ? ?You should call to schedule follow-up appointment with an ophthalmologist for Monday morning if possible.   ?

## 2022-03-20 NOTE — ED Triage Notes (Signed)
Pt states she had itching in both eyes.  She rubbed her right eye a lot.  Pt noticed her right eye was very red on Monday.  Noted red conjunctiva, drainage and crusting to right eye. ?

## 2022-03-20 NOTE — ED Provider Notes (Signed)
?[facility] EMERGENCY DEPARTMENT ?Provider Note ? ? ?CSN: [id number] ?Arrival date & time: 03/20/22  1694 ? ?  ? ?History ? ?Chief Complaint  ?Patient presents with  ? Eye Problem  ? ? ?Katherine Padilla is a 31 y.o. female presenting emergency department with painful red eye.  She reports that she has a lot of allergies and watery eyes, has been rubbing and scratching near her eye the past several days, and on Sunday felt that she poked her accidentally scratched her eye.  She said has been painful and watery on the right side since then.  She does not wear contacts but does wear glasses as needed.  She denies fevers or chills. ? ?HPI ? ?  ? ?Home Medications ?Prior to Admission medications   ?Medication Sig Start Date End Date Taking? Authorizing Provider  ?cephALEXin (KEFLEX) 500 MG capsule Take 1 capsule (500 mg total) by mouth 2 (two) times daily for 7 days. 03/20/22 03/27/22 Yes Cadyn Rodger, Kermit Balo, MD  ?erythromycin ophthalmic ointment Place a 1/2 inch ribbon of ointment into the RIGHT lower eyelid four times daily for 7 days 03/20/22  Yes Tivis Wherry, Kermit Balo, MD  ?norethindrone-ethinyl estradiol-FE (LOESTRIN FE) 1-20 MG-MCG tablet Take 1 tablet by mouth daily. 06/17/21  Yes [provider]  ?albuterol (VENTOLIN HFA) 108 (90 Base) MCG/ACT inhaler Inhale 1-2 puffs into the lungs every 6 (six) hours as needed for wheezing or shortness of breath. 11/18/20   Maxwell Caul, PA-C  ?BLISOVI FE 1/20 1-20 MG-MCG tablet Take 1 tablet by mouth daily. 02/26/22   [provider]  ?cetirizine (ZYRTEC ALLERGY) 10 MG tablet Take 1 tablet (10 mg total) by mouth daily. 10/11/21 01/19/22  Sloan Leiter, DO  ?fluticasone (FLONASE) 50 MCG/ACT nasal spray Place 1 spray into both nostrils daily. 10/11/21   Sloan Leiter, DO  ?   ? ?Allergies    ?Latex   ? ?Review of Systems   ?Review of Systems ? ?Physical Exam ?Updated Vital Signs ?BP 136/80 (BP Location: Right Arm)   Pulse (!) 104   Temp 98.4 ?F (36.9 ?C) (Oral)    Resp 18   Ht 5\' 2"  (1.575 m)   Wt 115.2 kg   LMP 03/09/2022   SpO2 100%   BMI 46.45 kg/m?  ?Physical Exam ?Constitutional:   ?   General: She is not in acute distress. ?HENT:  ?   Head: Normocephalic and atraumatic.  ?Eyes:  ?   Conjunctiva/sclera: Conjunctivae normal.  ?   Pupils: Pupils are equal, round, and reactive to light.  ?   Comments: Conjunctive is injected in the right (left conjunctiva is normal) ?Full range of motion intact in the bilateral ?PERRL ?No firmness, steaminess, bulging eye or proptosis ?Fluorescein staining demonstrates diffuse punctate uptake without focal lesion/abrasion/laceration.  Negative seidel test. ?Edema of the preseptal area  ?Skin: ?   General: Skin is warm and dry.  ?Neurological:  ?   General: No focal deficit present.  ?   Mental Status: She is alert. Mental status is at baseline.  ? ? ?ED Results / Procedures / Treatments   ?Labs ?(all labs ordered are listed, but only abnormal results are displayed) ?Labs Reviewed - No data to display ? ?EKG ?None ? ?Radiology ?No results found. ? ?Procedures ?Procedures  ? ? ?Medications Ordered in ED ?Medications  ?tetracaine (PONTOCAINE) 0.5 % ophthalmic solution 2 drop (2 drops Right Eye Given 03/20/22 0829)  ?fluorescein ophthalmic strip 1 strip (1 strip Right Eye  Given by Other 03/20/22 0829)  ?erythromycin ophthalmic ointment ( Right Eye Given 03/20/22 0855)  ?cephALEXin (KEFLEX) capsule 500 mg (500 mg Oral Given 03/20/22 0854)  ? ? ?ED Course/ Medical Decision Making/ A&P ?  ?                        ?Medical Decision Making ?Risk ?Prescription drug management. ? ? ?Red eye, itchy, right sided ?Ddx includes traumatic iritis vs keratitis vs conjunctivitis vs other ?There may be some preseptal cellulitis as well as some puffiness of the surrounding eye.  She does not have pain with extraocular movements or proptosis to suggest orbital cellulitis or warrant emergent CT imaging at this time. ? ?I do not see evidence of hypopyon, globe  rupture, or acute angle-closure glaucoma on my exam. ? ?I will start her on erythromycin ointment and keflex, advise follow-up with an ophthalmologist early next week if her symptoms or not improving.  Otherwise she is stable for discharge ? ? ? ? ? ? ? ? ?Final Clinical Impression(s) / ED Diagnoses ?Final diagnoses:  ?Acute right eye pain  ?Eye redness  ?Preseptal cellulitis of right eye  ? ? ?Rx / DC Orders ?ED Discharge Orders   ? ?      Ordered  ?  cephALEXin (KEFLEX) 500 MG capsule  2 times daily       ? 03/20/22 0844  ?  erythromycin ophthalmic ointment       ? 03/20/22 0844  ? ?  ?  ? ?  ? ? ?  ?Terald Sleeper, MD ?03/20/22 [phone] ? ?

## 2022-12-05 ENCOUNTER — Other Ambulatory Visit (HOSPITAL_BASED_OUTPATIENT_CLINIC_OR_DEPARTMENT_OTHER): Payer: Self-pay

## 2022-12-05 ENCOUNTER — Other Ambulatory Visit: Payer: Self-pay

## 2022-12-05 ENCOUNTER — Emergency Department (HOSPITAL_BASED_OUTPATIENT_CLINIC_OR_DEPARTMENT_OTHER)
Admission: EM | Admit: 2022-12-05 | Discharge: 2022-12-05 | Disposition: A | Discharge status: Home / Self Care | Payer: Medicaid Other | Attending: Emergency Medicine | Admitting: Emergency Medicine

## 2022-12-05 ENCOUNTER — Encounter (HOSPITAL_BASED_OUTPATIENT_CLINIC_OR_DEPARTMENT_OTHER): Payer: Self-pay

## 2022-12-05 DIAGNOSIS — Z91040 Latex allergy status: Secondary | ICD-10-CM | POA: Diagnosis not present

## 2022-12-05 DIAGNOSIS — M545 Low back pain, unspecified: Secondary | ICD-10-CM | POA: Diagnosis present

## 2022-12-05 MED ORDER — METHYLPREDNISOLONE 4 MG PO TBPK
ORAL_TABLET | ORAL | 0 refills | Status: DC
  Filled 2022-12-05: qty 21, 6d supply, fill #0

## 2022-12-05 MED ORDER — CYCLOBENZAPRINE HCL 10 MG PO TABS
10.0000 mg | ORAL_TABLET | Freq: Two times a day (BID) | ORAL | 0 refills | Status: AC | PRN
  Filled 2022-12-05: qty 20, 10d supply, fill #0

## 2022-12-05 NOTE — ED Provider Notes (Signed)
[facility] EMERGENCY DEPARTMENT Provider Note   CSN: [id number] Arrival date & time: 12/05/22  5916     History  Chief Complaint  Patient presents with   Back Pain    Katherine Padilla is a 31 y.o. female.  Patient here with lower back pain mostly left-sided.  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  Pain worse when she is moving things or lifting things.  Denies any loss of bowel or bladder.  She is currently on her second day of her menstrual cycle.  She is not concerned for pregnancy.  She not having any abdominal pain or pain with urination.  Denies any weakness or numbness, there is no paresthesias.  No specific traumatic process.  Nothing makes it better.  Movement makes it worse.  The history is provided by the patient.       Home Medications Prior to Admission medications   Medication Sig Start Date End Date Taking? Authorizing Provider  cyclobenzaprine (FLEXERIL) 10 MG tablet Take 1 tablet (10 mg total) by mouth 2 (two) times daily as needed for muscle spasms. 12/05/22  Yes Madeleine Fenn, DO  methylPREDNISolone (MEDROL DOSEPAK) 4 MG TBPK tablet Follow package insert 12/05/22  Yes Drue Camera, DO  albuterol (VENTOLIN HFA) 108 (90 Base) MCG/ACT inhaler Inhale 1-2 puffs into the lungs every 6 (six) hours as needed for wheezing or shortness of breath. 11/18/20   Maxwell Caul, PA-C  BLISOVI FE 1/20 1-20 MG-MCG tablet Take 1 tablet by mouth daily. 02/26/22   [provider]  cetirizine (ZYRTEC ALLERGY) 10 MG tablet Take 1 tablet (10 mg total) by mouth daily. 10/11/21 01/19/22  Tanda Rockers A, DO  erythromycin ophthalmic ointment Place a 1/2 inch ribbon of ointment into the RIGHT lower eyelid four times daily for 7 days 03/20/22   Terald Sleeper, MD  fluticasone [facility]) 50 MCG/ACT nasal spray Place 1 spray into both nostrils daily. 10/11/21   Sloan Leiter, DO  norethindrone-ethinyl estradiol-FE (LOESTRIN FE) 1-20 MG-MCG tablet Take 1 tablet by  mouth daily. 06/17/21   [provider]      Allergies    Latex    Review of Systems   Review of Systems  Physical Exam Updated Vital Signs BP (!) 151/85   Pulse (!) 101   Temp 97.9 F (36.6 C)   Resp 16   LMP 12/04/2022   SpO2 100%  Physical Exam Vitals and nursing note reviewed.  Constitutional:      General: She is not in acute distress.    Appearance: She is well-developed.  HENT:     Head: Normocephalic and atraumatic.     Nose: Nose normal.     Mouth/Throat:     Mouth: Mucous membranes are moist.  Eyes:     Extraocular Movements: Extraocular movements intact.     Conjunctiva/sclera: Conjunctivae normal.     Pupils: Pupils are equal, round, and reactive to light.  Cardiovascular:     Rate and Rhythm: Normal rate and regular rhythm.     Pulses: Normal pulses.     Heart sounds: Normal heart sounds. No murmur heard. Pulmonary:     Effort: Pulmonary effort is normal. No respiratory distress.     Breath sounds: Normal breath sounds.  Abdominal:     Palpations: Abdomen is soft.     Tenderness: There is no abdominal tenderness.  Musculoskeletal:        General: Tenderness present. No swelling. Normal range of motion.  Cervical back: Normal range of motion and neck supple. No tenderness.  Skin:    General: Skin is warm and dry.     Capillary Refill: Capillary refill takes less than 2 seconds.  Neurological:     General: No focal deficit present.     Mental Status: She is alert and oriented to person, place, and time.     Cranial Nerves: No cranial nerve deficit.     Sensory: No sensory deficit.     Motor: No weakness.     Coordination: Coordination normal.     Gait: Gait normal.  Psychiatric:        Mood and Affect: Mood normal.     ED Results / Procedures / Treatments   Labs (all labs ordered are listed, but only abnormal results are displayed) Labs Reviewed - No data to display   EKG None  Radiology No results  found.  Procedures Procedures    Medications Ordered in ED Medications - No data to display  ED Course/ Medical Decision Making/ A&P                           Medical Decision Making Amount and/or Complexity of Data Reviewed Labs: ordered.  Risk Prescription drug management.   [employer] [facility] [employer] is here with back pain.  Normal vitals.  No fever.  Differential diagnosis likely muscle spasm, overuse process/MSK type pain.  I have no concern for kidney stone or intra-abdominal process.  She is on her second day of menstrual cycle and she is not concern for being pregnant.  She not having any vaginal bleeding or vaginal discharge or abdominal pain.  Overall she is tender in the paraspinal lumbar muscles bilaterally but worse on the left than the right.  Suspect that this is from muscle spasm.  Will prescribe Medrol Dosepak, Flexeril.  Recommend continued use of Tylenol.  Discharged in good condition.  Understands return precautions.  She is not having any symptoms of cauda equina or other spinal issues.  She does not have any issues urinating.  She is not having any numbness or weakness.  She understands return precautions.  This chart was dictated using voice recognition software.  Despite best efforts to proofread,  errors can occur which can change the documentation meaning.         Final Clinical Impression(s) / ED Diagnoses Final diagnoses:  Acute low back pain, unspecified back pain laterality, unspecified whether sciatica present    Rx / DC Orders ED Discharge Orders          Ordered    methylPREDNISolone (MEDROL DOSEPAK) 4 MG TBPK tablet        12/05/22 0829    cyclobenzaprine (FLEXERIL) 10 MG tablet  2 times daily PRN        12/05/22 0829              Virgina Norfolk, DO 12/05/22 0831

## 2022-12-05 NOTE — ED Triage Notes (Signed)
Pt ambulatory to room . Mid to lower burning pain in back Tender to touch No injury

## 2022-12-05 NOTE — Discharge Instructions (Signed)
Recommend 1 mg of Tylenol every 6 hours as needed for pain.  Follow instructions for steroids on your prescription.  I have written you Flexeril which is a muscle relaxant, this medication is sedating.  Do not use while doing dangerous activities including driving.

## 2022-12-05 NOTE — ED Notes (Signed)
Reviewed discharge instructions and medications with pt. States understanding. Work note provided. Pt ambulatory for discharge

## 2024-07-15 ENCOUNTER — Encounter: Payer: Self-pay | Admitting: Internal Medicine

## 2024-07-15 ENCOUNTER — Ambulatory Visit (INDEPENDENT_AMBULATORY_CARE_PROVIDER_SITE_OTHER): Admitting: Internal Medicine

## 2024-07-15 VITALS — BP 112/68 | HR 88 | Temp 97.7°F | Resp 20 | Ht 61.5 in | Wt 267.0 lb

## 2024-07-15 DIAGNOSIS — L309 Dermatitis, unspecified: Secondary | ICD-10-CM | POA: Insufficient documentation

## 2024-07-15 DIAGNOSIS — H1013 Acute atopic conjunctivitis, bilateral: Secondary | ICD-10-CM | POA: Diagnosis not present

## 2024-07-15 DIAGNOSIS — J310 Chronic rhinitis: Secondary | ICD-10-CM

## 2024-07-15 DIAGNOSIS — J4530 Mild persistent asthma, uncomplicated: Secondary | ICD-10-CM | POA: Diagnosis not present

## 2024-07-15 DIAGNOSIS — J383 Other diseases of vocal cords: Secondary | ICD-10-CM | POA: Diagnosis not present

## 2024-07-15 DIAGNOSIS — L308 Other specified dermatitis: Secondary | ICD-10-CM

## 2024-07-15 DIAGNOSIS — H02716 Chloasma of left eye, unspecified eyelid and periocular area: Secondary | ICD-10-CM

## 2024-07-15 DIAGNOSIS — H02713 Chloasma of right eye, unspecified eyelid and periocular area: Secondary | ICD-10-CM

## 2024-07-15 MED ORDER — LEVOCETIRIZINE DIHYDROCHLORIDE 5 MG PO TABS: 5.0000 mg | ORAL_TABLET | Freq: Every evening | ORAL | 5 refills | Status: AC

## 2024-07-15 MED ORDER — CROMOLYN SODIUM 4 % OP SOLN: 1.0000 [drp] | Freq: Four times a day (QID) | OPHTHALMIC | 3 refills | Status: AC | PRN

## 2024-07-15 MED ORDER — EUCRISA 2 % EX OINT: TOPICAL_OINTMENT | CUTANEOUS | 3 refills | Status: AC

## 2024-07-15 MED ORDER — AZELASTINE-FLUTICASONE 137-50 MCG/ACT NA SUSP: 1.0000 | Freq: Two times a day (BID) | NASAL | 5 refills | Status: AC | PRN

## 2024-07-15 MED ORDER — TRIAMCINOLONE ACETONIDE 0.1 % EX OINT: TOPICAL_OINTMENT | CUTANEOUS | 1 refills | Status: AC

## 2024-07-15 MED ORDER — AIRSUPRA 90-80 MCG/ACT IN AERO: 2.0000 | INHALATION_SPRAY | RESPIRATORY_TRACT | 3 refills | Status: AC | PRN

## 2024-07-15 NOTE — Progress Notes (Signed)
 NEW PATIENT Date of Service/Encounter:   07/15/2024 Referring provider: [facility] Primary care provider: [facility]  Subjective:  Katherine Padilla is a 33 y.o. female presenting today for evaluation of chronic rhinitis, asthma, eczema History obtained from: chart review and patient.   Discussed the use of AI scribe software for clinical note transcription with the patient, who gave verbal consent to proceed.  History of Present Illness Katherine Padilla is a 33 year old female with allergies and asthma who presents with worsening symptoms.  Allergic rhinitis and atopic symptoms - Persistent symptoms throughout the year without seasonal variation - Sneezing, itchy throat, itchy nose, and dark circles under the eyes - Itching under the neck after sneezing or coughing - Multiple antihistamines tried (Zyrtec , Claritin, Allegra, Benadryl) and intranasal corticosteroid (Flonase ) without significant relief  Asthmatic symptoms - Long-standing symptoms since high school, initially requiring breathing treatments - Progressive worsening over the years - Chest tightness and wheezing, especially after sneezing multiple times or exposure to triggers such as cooking spices or perfumes - Significant fatigue after sneezing and chest tightness following a simple cough - No rescue inhaler available; albuterol  provides only temporary relief - Severe episode in the past year requiring emergency medical attention for severe shortness of breath -will have prolonged chest tightness during colds -does not currently have any access to albuterol  but has not found it helpful during acute onset attacks  Eczema and cutaneous findings - History of eczema with dark patches on hands and chest - Uses La Roche Posay double repair cream for dry skin - No use of prescribed topical steroid creams for eczema  Gastroesophageal reflux symptoms - Occasional acid reflux managed by dietary avoidance of  certain foods  Environmental exposures - Exposure to secondhand smoke from a neighbor in her apartment, which sometimes exacerbates asthma symptoms - Neighbor has reduced smoking after being reported - No personal history of tobacco use  Surgical history related to allergic disease - No history of adenoid or sinus surgeries  Vaccines are up to date.   Past Medical History: Past Medical History:  Diagnosis Date   Asthma    Medication List:  Current Outpatient Medications  Medication Sig Dispense Refill   albuterol  (VENTOLIN  HFA) 108 (90 Base) MCG/ACT inhaler Inhale 1-2 puffs into the lungs every 6 (six) hours as needed for wheezing or shortness of breath. (Patient not taking: Reported on 07/15/2024) 1 each 1   BLISOVI FE 1/20 1-20 MG-MCG tablet Take 1 tablet by mouth daily. (Patient not taking: Reported on 07/15/2024)     cetirizine  (ZYRTEC  ALLERGY) 10 MG tablet Take 1 tablet (10 mg total) by mouth daily. (Patient not taking: Reported on 07/15/2024) 100 tablet 3   cyclobenzaprine  (FLEXERIL ) 10 MG tablet Take 1 tablet (10 mg total) by mouth 2 (two) times daily as needed for muscle spasms. (Patient not taking: Reported on 07/15/2024) 20 tablet 0   erythromycin  ophthalmic ointment Place a 1/2 inch ribbon of ointment into the RIGHT lower eyelid four times daily for 7 days (Patient not taking: Reported on 07/15/2024) 3.5 g 0   fluticasone  (FLONASE ) 50 MCG/ACT nasal spray Place 1 spray into both nostrils daily. (Patient not taking: Reported on 07/15/2024) 16 g 0   methylPREDNISolone  (MEDROL  DOSEPAK) 4 MG TBPK tablet Follow package insert (Patient not taking: Reported on 07/15/2024) 21 each 0   norethindrone-ethinyl estradiol-FE (LOESTRIN FE) 1-20 MG-MCG tablet Take 1 tablet by mouth daily. (Patient not taking: Reported on 07/15/2024)     No  current facility-administered medications for this visit.   Known Allergies:  Allergies  Allergen Reactions   Latex Itching   Past Surgical History: Past  Surgical History:  Procedure Laterality Date   CESAREAN SECTION     Family History: Family History  Problem Relation Age of Onset   Allergic rhinitis Neg Hx    Angioedema Neg Hx    Asthma Neg Hx    Atopy Neg Hx    Eczema Neg Hx    Immunodeficiency Neg Hx    Urticaria Neg Hx    Social History: Katherine Padilla lives in an apartment without water damage, concrete floors, electric heat, central AC, no pets, no roaches, not using dust mite covers on the bed of the pillows, no smoke exposure.  Works as a Advertising copywriter.  She is exposed to fumes chemicals or dust at her job.  No HEPA filter in the home.  [place]   ROS:  All other systems negative except as noted per HPI.  Objective:  Blood pressure 112/68, pulse 88, temperature 97.7 F (36.5 C), temperature source Temporal, resp. rate 20, height 5' 1.5 (1.562 m), weight 267 lb (121.1 kg), SpO2 98%, unknown if currently breastfeeding. Body mass index is 49.63 kg/m. Physical Exam:  General Appearance:  Alert, cooperative, no distress, appears stated age  Head:  Normocephalic, without obvious abnormality, atraumatic  Eyes:  Conjunctiva clear, EOM's intact  Ears EACs normal bilaterally and normal TMs bilaterally  Nose: Nares normal, hypertrophic turbinates, normal mucosa, and no visible anterior polyps  Throat: Lips, tongue normal; teeth and gums normal, normal posterior oropharynx and tonsils 3+  Neck: Supple, symmetrical  Lungs:   clear to auscultation bilaterally, Respirations unlabored, no coughing  Heart:  regular rate and rhythm and no murmur, Appears well perfused  Extremities: No edema  Skin: Lichenification and hyperpigmentation bilateral lower eyelids, upper chest, and bilateral hands  Neurologic: No gross deficits   Diagnostics: Spirometry:  Tracings reviewed. Her effort: Good reproducible efforts. FVC: 2.56L  FEV1: 1.99L, 79% predicted  FEV1/FVC ratio: 0.78 Interpretation: Spirometry consistent  with normal pattern.  Please see scanned spirometry results for details.  Labs:  Lab Orders  No laboratory test(s) ordered today     Assessment and Plan  Assessment and Plan Assessment & Plan Allergic rhinitis and conjunctivitis Chronic allergic rhinitis unresponsive to oral antihistamines or nasal corticosteroids. Consideration of environmental allergies and potential triggers. - Schedule skin prick testing for environmental allergens on August 1st at 9 AM. - Instruct to stop antihistamines three days prior to skin prick testing. - Prescribe Xyzal (levocetirizine) 5 mg daily as needed and instruct to hold three days before testing. - Prescribe Dymista nasal spray - 1 spray twice daily as needed and instruct to hold three days before testing. - Prescribe eye drops for itchy eyes. 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 - Educate on vocal cord exercises to alleviate symptoms. See below. - Monitor response to exercises and Airsupra inhaler to differentiate between asthma and laryngospasm.  Asthma Asthma with chest tightness and wheezing, exacerbated by colds and environmental triggers. Limited relief from albuterol  inhaler. Suspected laryngospasm contributing to symptoms. - Prescribe Airsupra inhaler for use as needed and during colds. - Instruct to use Airsupra two puffs twice daily during colds, up to 12 puffs per day if needed. - Educate on vocal cord exercises to manage  suspected laryngospasms. For immediate onset symptoms or symptoms brought on by sneezing or smells, try vocal cord exercises first, and if no response after a few minutes, use airsupra.   Eczema-eyelids and hands as well as upper chest Chronic eczema with dark patches and itching, worsened by dry skin. No previous use of topical steroids. - Prescribe  triamcinolone cream for body rashes, avoiding face, armpits, and groin. Use twice daily as needed on body. Do not use on face, groin or arm pits. - Prescribe Eucrisa for facial eczema, especially around the eyelids. Use twice daily as needed during flares. - Refer to dermatologist for further management of facial hyperpigmentation. - Advise on use of hypoallergenic moisturizers without fragrances or dyes. -key to eczema is keeping skin hydrated multiple times daily to help prevent flares - use Sunblock (black girl Sunblock) every day to help prevent further pigmentation issues and prevent sun damage - Schedule patch testing to look for any potential contacts allergens that may be contributing to eyelid dermatitis.  Follow up : next Friday August 1st for allergy skin testing (1-55).  Must stop antihistamines 3 days prior to visit. It was a pleasure meeting you in clinic today! Thank you for allowing me to participate in your care.  Rocky Endow, MD [facility]       This note in its entirety was forwarded to the Provider who requested this consultation.  Other: samples provided of vanicream, eucrisa  Thank you for your kind referral. I appreciate the opportunity to take part in Katherine Padilla's care. Please do not hesitate to contact me with questions.  Sincerely,  Rocky Endow, MD [facility] 

## 2024-07-15 NOTE — Patient Instructions (Addendum)
 Allergic rhinitis and conjunctivitis Chronic allergic rhinitis unresponsive to oral antihistamines or nasal corticosteroids. Consideration of environmental allergies and potential triggers. - Schedule skin prick testing for environmental allergens on August 1st at 9 AM. - Instruct to stop antihistamines three days prior to skin prick testing. - Prescribe Xyzal (levocetirizine) 5 mg daily as needed and instruct to hold three days before testing. - Prescribe Dymista nasal spray - 1 spray twice daily as needed and instruct to hold three days before testing. - Prescribe eye drops for itchy eyes. 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 - Educate on vocal cord exercises to alleviate symptoms. See below. - Monitor response to exercises and Airsupra inhaler to differentiate between asthma and laryngospasm.  Asthma Asthma with chest tightness and wheezing, exacerbated by colds and environmental triggers. Limited relief from albuterol  inhaler. Suspected laryngospasm contributing to symptoms. - Prescribe Airsupra inhaler for use as needed and during colds. - Instruct to use Airsupra two puffs twice daily during colds, up to 12 puffs per day if needed. - Educate on vocal cord exercises to manage suspected laryngospasms. For immediate onset symptoms or symptoms brought on by sneezing or smells, try vocal cord exercises first, and if no response after a few minutes, use airsupra.   Eczema-eyelids and hands as well as upper chest Chronic eczema with dark patches and itching, worsened by dry skin. No previous use of topical steroids. - Prescribe triamcinolone cream for body rashes, avoiding face, armpits, and groin. Use twice daily as needed on body. Do not use on face, groin or arm pits. - Prescribe Eucrisa for facial eczema, especially around the eyelids.  Use twice daily as needed during flares. - Refer to dermatologist for further management of facial hyperpigmentation. - Advise on use of hypoallergenic moisturizers without fragrances or dyes. -key to eczema is keeping skin hydrated multiple times daily to help prevent flares - use Sunblock (black girl Sunblock) every day to help prevent further pigmentation issues and prevent sun damage - Schedule patch testing to look for any potential contacts allergens that may be contributing to eyelid dermatitis.  Follow up : next Friday August 1st for allergy skin testing (1-55).  Must stop antihistamines 3 days prior to visit. It was a pleasure meeting you in clinic today! Thank you for allowing me to participate in your care.  Rocky Endow, MD [facility]  Vocal Cord Dysfunction (VCD) /Inspiratory Laryngeal Obstruction (ILO)/ Exercise- Induced Laryngeal Obstruction Exercises (EILO)  Practice these techniques several times a day, so that when you need them, they will be automatic, and will work right away (or very fast) to open up the spasming (closed) vocal cords.  PREPARATION: ? Loosen clothing at waist, so nothing is tight or snug. Open top buttons of pants or skirt, unzip pant or skirt zippers, pull shirt out over pants or skirt. This prevents pressure on the stomach, which can cause stomach reflux (backup of corrosive liquid) into the esophagus. Gastric reflux is a frequent cause of VCD attacks. ? Sit in a comfortable chair. When you need to use these techniques, you may be standing, lying, or sitting-BUT first try to learn them while sitting because they are easier to learn in this position. ? Keep water handy. Take sips, so your mouth and throat will not dry out. Swallow carefully, to avoid choking. ? Put your right (  or left) thumb on your belly button, with the rest of your fingers below the thumb, so you are GENTLY touching your belly (lower abdomen). ? Doing this  will focus your attention on your lower abdominal muscles. ? Relax your chest. Relax your shoulders. Relax your neck. Relax your throat. This helps you to try to use ONLY your belly (lower abdomen) muscles. Consciously try NOT to use chest muscles, etc. Chest muscle use seems to irritate vocal cords while "belly" muscles tend to relax them.  BREATHE OUT (EXHALE) FIRST WITH slightly PURSED LIPS: ? Since most people cannot inhale, (breathe in), during VCD attacks, please start by breathing out (exhaling) in the special way described below, and this will usually open up the vocal cords, immediately. ? Start, by breathing out (exhaling) with lips "pursed" as if you are trying to gently blow out a candle. ? This is similar to whistling, but your lips will not be as "puckered' as when whistling and your lips will not be pushed forward, like when whistling. If you look in a mirror, you would see a mostly horizontal line of space between the lips, while you exhale the 'ffffffffffffffffffff'. ? This may be silent, or may sound like a gentle wind or breeze, like 'fffffffffffffff' and your lips should be symmetrical, around your teeth. You lower lip will not be touching your upper teeth, like when someone says the name FFFFFFFRank. This is one continuous flow of exhaled air, either silent, or making a slightly windy sound. ? Feel your hand on your belly (lower abdomen) come IN, a little bit toward your back, as you are 'working' only your lower belly muscles. ? This abdominal exhaled 'fffffffffffffffff' alone often stops VCD attacks very quickly.  ? Some prefer to try a variation of exhaling 'ffffffffffff'. Try exhaling quickly, 'ffff', 'fffff', 'ffff'--all part of one exhalation. Do not inhale in between quick "fff's. This helps some people more quickly open up the spasming vocal cords. This is like blowing out a slightly stubborn candle, exhaling against a tiny bit of resistance (like trying to blow out a  trick birthday candle). ? If any of this helps, try to slow down the exhalations, and gently exhale 'ffffffffffffffffff'. ? Some people prefer to exhale gently 'ssssssssssssssss' or 'shhhhhhhhhhhhh' rather than 'fffffffffffffff', etc. Choose what works best in your case, or what is most comfortable for you. ? You may need to repeat exhaling 'ffffffffffffff' (or 'ffff', 'ffff', 'ffff'), etc, several times to relax the spasming vocal cords. Repeating this often stops a VCD attack so that you can inhale (breathe in) again.

## 2024-07-18 ENCOUNTER — Telehealth: Payer: Self-pay

## 2024-07-18 NOTE — Telephone Encounter (Signed)
*  AA  Pharmacy Patient Advocate Encounter   Received notification from CoverMyMeds that prior authorization for Eucrisa  2% ointment  is required/requested.   Insurance verification completed.   The patient is insured through [facility] .   Per test claim: PA required; PA submitted to above mentioned insurance via CoverMyMeds Key/confirmation #/EOC [id number] Status is pending

## 2024-07-19 ENCOUNTER — Other Ambulatory Visit (HOSPITAL_COMMUNITY): Payer: Self-pay

## 2024-07-19 NOTE — Telephone Encounter (Signed)
 Pharmacy Patient Advocate Encounter  Received notification from [facility] that Prior Authorization for Eucrisa  2% ointment  has been APPROVED from 07/18/2024 to 07/18/2025. Ran test claim, Copay is $4.00. This test claim was processed through [facility]- copay amounts may vary at other pharmacies due to pharmacy/plan contracts, or as the patient moves through the different stages of their insurance plan.

## 2024-07-22 ENCOUNTER — Encounter: Payer: Self-pay | Admitting: Internal Medicine

## 2024-07-22 ENCOUNTER — Ambulatory Visit: Admitting: Internal Medicine

## 2024-07-22 ENCOUNTER — Other Ambulatory Visit (HOSPITAL_COMMUNITY): Payer: Self-pay

## 2024-07-22 ENCOUNTER — Telehealth: Payer: Self-pay

## 2024-07-22 DIAGNOSIS — J3089 Other allergic rhinitis: Secondary | ICD-10-CM | POA: Diagnosis not present

## 2024-07-22 NOTE — Progress Notes (Signed)
 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  Please see that note for additional details.  Today reports for allergy diagnostic testing:    DIAGNOSTICS:  Skin Testing: Environmental allergy panel. Adequate positive and negative controls. Results discussed with patient/family.  Airborne Adult Perc - 07/22/24 0934     1. Control-Buffer 50% Glycerol Negative    2. Control-Histamine 3+    3. Bahia Negative    4. French Southern Territ[place] Negative    5. Johnson Negative    6. [place]  Blue Negative    7. Meadow Fescue Negative    8. Perennial Rye Negative    9. Timothy Negative    10. Ragweed Mix Negative    11. Cocklebur Negative    12. Plantain,  English Negative    13. Baccharis Negative    14. Dog Fennel Negative    15. Russian Thistle Negative    16. Lamb's Quarters Negative    17. Sheep Sorrell Negative    18. Rough Pigweed Negative    19. Marsh Elder, Rough Negative    20. Mugwort, Common Negative    21. Box, Elder Negative    22. Cedar, red Negative    23. Sweet Gum Negative    24. Pecan Pollen Negative    25. Pine Mix Negative    26. Walnut, Black Pollen Negative    27. Red Mulberry Negative    28. Ash Mix Negative    29. Birch Mix Negative    30. Beech American Negative    31. Cottonwood, Guinea-Bissau Negative    32. Hickory, White Negative    33. Maple Mix Negative    34. Oak, Guinea-[place]issau Mix Negative    35. Sycamore Eastern Negative    36. Alternaria Alternata Negative    37. Cladosporium Herbarum Negative    38. Aspergillus Mix Negative    39. Penicillium Mix Negative    40. Bipolaris Sorokiniana (Helminthosporium) Negative    41. Drechslera Spicifera (Curvularia) Negative    42. Mucor Plumbeus Negative    43. Fusarium Moniliforme Negative    44. Aureobasidium Pullulans (pullulara) Negative    45. Rhizopus Oryzae Negative    46.  Botrytis Cinera Negative    47. Epicoccum Nigrum Negative    48. Phoma Betae Negative    49. Dust Mite Mix 3+    50. Cat Hair 10,000 BAU/ml Negative    51.  Dog Epithelia Negative    52. Mixed Feathers Negative    53. Horse Epithelia Negative    54. Cockroach, German 3+    55. Tobacco Leaf Negative          Intradermal - 07/22/24 0936     Location Arm    Number of Test 14    Control Negative   Simultaneous filing. User may not have seen previous data.   Bahia Negative   Simultaneous filing. User may not have seen previous data.   [place] Negative   Simultaneous filing. User may not have seen previous data.   Johnson Negative   Simultaneous filing. User may not have seen previous data.   7 Grass Negative   Simultaneous filing. User may not have seen previous data.   Ragweed Mix Negative   Simultaneous filing. User may not have seen previous data.   Weed Mix Negative   Simultaneous filing. User may not have seen previous data.   Tree Mix Negative  Simultaneous filing. User may not have seen previous data.   Mold 1 3+   Simultaneous filing. User may not have seen previous data.   Mold 2 3+   Simultaneous filing. User may not have seen previous data.   Mold 3 3+   Simultaneous filing. User may not have seen previous data.   Mold 4 3+   Simultaneous filing. User may not have seen previous data.   Cat 4+   Simultaneous filing. User may not have seen previous data.   Dog 4+   Simultaneous filing. User may not have seen previous data.         Allergy testing results were read and interpreted by myself, documented by clinical staff.  Patient provided with copy of allergy testing along with avoidance measures when indicated.   Rocky Endow, MD  [facility]  

## 2024-07-22 NOTE — Telephone Encounter (Signed)
*  AA  Pharmacy Patient Advocate Encounter   Received notification from CoverMyMeds that prior authorization for Airsupra  90-80MCG/ACT aerosol  is required/requested.   Insurance verification completed.   The patient is insured through [facility] .   Per test claim: PA required; PA submitted to above mentioned insurance via CoverMyMeds Key/confirmation #/EOC [id number] Status is pending

## 2024-07-22 NOTE — Patient Instructions (Addendum)
 Allergic rhinitis and conjunctivitis Chronic allergic rhinitis unresponsive to oral antihistamines or nasal corticosteroids. Consideration of environmental allergies and potential triggers. - skin testing 07/22/24: positive to cockroach and dust mites; intradermals positive to indoor and outdoor molds, cat and dog. allergen avoidance.  -Consider allergy injections to reduce lifetime symptoms and need for medications by teaching your immune system to become tolerant of the environmental allergens you are allergic to - Xyzal  (levocetirizine) 5 mg daily as needed  - Dymista  nasal spray - 1 spray twice daily as needed  - Prescribe eye drops for itchy eyes. 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 - Educate on vocal cord exercises to alleviate symptoms. See below. - Monitor response to exercises and Airsupra  inhaler to differentiate between asthma and laryngospasm.  Asthma Asthma with chest tightness and wheezing, exacerbated by colds and environmental triggers. Limited relief from albuterol  inhaler. Suspected laryngospasm contributing to symptoms. - Prescribe Airsupra  inhaler for use as needed and during colds. - Instruct to use Airsupra  two puffs twice daily during colds, up to 12 puffs per day if needed. - Educate on vocal cord exercises to manage suspected laryngospasms. For immediate onset symptoms or symptoms brought on by sneezing or smells, try vocal cord exercises first, and if no response after a few minutes, use airsupra .   Eczema-eyelids and hands as well as upper chest Chronic eczema with dark patches and itching, worsened by dry skin. No previous use of topical steroids. - Prescribe triamcinolone  cream for body rashes, avoiding face, armpits, and groin. Use twice daily as needed on body. Do not use on face, groin or arm pits. -  Prescribe Eucrisa  for facial eczema, especially around the eyelids. Use twice daily as needed during flares. - Refer to dermatologist for further management of facial hyperpigmentation. - Advise on use of hypoallergenic moisturizers without fragrances or dyes. -key to eczema is keeping skin hydrated multiple times daily to help prevent flares - use Sunblock (black girl Sunblock) every day to help prevent further pigmentation issues and prevent sun damage   Follow up :   -patch testing scheduled for Monday 08/01/24-in [place] office.  [address] [place], [place] [address] Wednesday and Friday patch read appointments in [place] office  It was a pleasure meeting you in clinic today! Thank you for allowing me to participate in your care.  Rocky Endow, MD [facility]  Vocal Cord Dysfunction (VCD) /Inspiratory Laryngeal Obstruction (ILO)/ Exercise- Induced Laryngeal Obstruction Exercises (EILO)  Practice these techniques several times a day, so that when you need them, they will be automatic, and will work right away (or very fast) to open up the spasming (closed) vocal cords.  PREPARATION: ? Loosen clothing at waist, so nothing is tight or snug. Open top buttons of pants or skirt, unzip pant or skirt zippers, pull shirt out over pants or skirt. This prevents pressure on the stomach, which can cause stomach reflux (backup of corrosive liquid) into the esophagus. Gastric reflux is a frequent cause of VCD attacks. ? Sit in a comfortable chair. When you need to use these techniques, you may be standing, lying, or sitting-BUT first try to learn them while sitting because they are easier to learn in this position. ? Keep water handy. Take sips, so your mouth and throat will not dry out. Swallow carefully, to avoid choking. ? Put your  right (or left) thumb on your belly button, with the rest of your fingers below the thumb, so you are GENTLY touching your belly (lower abdomen). ?  Doing this will focus your attention on your lower abdominal muscles. ? Relax your chest. Relax your shoulders. Relax your neck. Relax your throat. This helps you to try to use ONLY your belly (lower abdomen) muscles. Consciously try NOT to use chest muscles, etc. Chest muscle use seems to irritate vocal cords while "belly" muscles tend to relax them.  BREATHE OUT (EXHALE) FIRST WITH slightly PURSED LIPS: ? Since most people cannot inhale, (breathe in), during VCD attacks, please start by breathing out (exhaling) in the special way described below, and this will usually open up the vocal cords, immediately. ? Start, by breathing out (exhaling) with lips "pursed" as if you are trying to gently blow out a candle. ? This is similar to whistling, but your lips will not be as "puckered' as when whistling and your lips will not be pushed forward, like when whistling. If you look in a mirror, you would see a mostly horizontal line of space between the lips, while you exhale the 'ffffffffffffffffffff'. ? This may be silent, or may sound like a gentle wind or breeze, like 'fffffffffffffff' and your lips should be symmetrical, around your teeth. You lower lip will not be touching your upper teeth, like when someone says the name FFFFFFFRank. This is one continuous flow of exhaled air, either silent, or making a slightly windy sound. ? Feel your hand on your belly (lower abdomen) come IN, a little bit toward your back, as you are 'working' only your lower belly muscles. ? This abdominal exhaled 'fffffffffffffffff' alone often stops VCD attacks very quickly.  ? Some prefer to try a variation of exhaling 'ffffffffffff'. Try exhaling quickly, 'ffff', 'fffff', 'ffff'--all part of one exhalation. Do not inhale in between quick "fff's. This helps some people more quickly open up the spasming vocal cords. This is like blowing out a slightly stubborn candle, exhaling against a tiny bit of resistance (like trying to  blow out a trick birthday candle). ? If any of this helps, try to slow down the exhalations, and gently exhale 'ffffffffffffffffff'. ? Some people prefer to exhale gently 'ssssssssssssssss' or 'shhhhhhhhhhhhh' rather than 'fffffffffffffff', etc. Choose what works best in your case, or what is most comfortable for you. ? You may need to repeat exhaling 'ffffffffffffff' (or 'ffff', 'ffff', 'ffff'), etc, several times to relax the spasming vocal cords. Repeating this often stops a VCD attack so that you can inhale (breathe in) again.

## 2024-07-25 ENCOUNTER — Other Ambulatory Visit (HOSPITAL_COMMUNITY): Payer: Self-pay

## 2024-07-25 NOTE — Telephone Encounter (Signed)
.  Pharmacy Patient Advocate Encounter  Received notification from [facility] that Prior Authorization for Airsupra  90-80MCG/ACT aerosol  has been APPROVED from 07/22/2024 to 07/22/2025. Ran test claim, Copay is $4.00. This test claim was processed through [facility]- copay amounts may vary at other pharmacies due to pharmacy/plan contracts, or as the patient moves through the different stages of their insurance plan.   PA #/Case ID/Reference #: [id number]

## 2024-08-01 ENCOUNTER — Encounter: Payer: Self-pay | Admitting: Family Medicine

## 2024-08-01 ENCOUNTER — Ambulatory Visit (INDEPENDENT_AMBULATORY_CARE_PROVIDER_SITE_OTHER): Admitting: Family Medicine

## 2024-08-01 DIAGNOSIS — L235 Allergic contact dermatitis due to other chemical products: Secondary | ICD-10-CM

## 2024-08-01 DIAGNOSIS — L259 Unspecified contact dermatitis, unspecified cause: Secondary | ICD-10-CM | POA: Insufficient documentation

## 2024-08-01 NOTE — Progress Notes (Signed)
 Follow-up Note  RE: Katherine Padilla MRN: [id number] DOB: [birth year] Date of Office Visit: 08/01/2024  Primary care provider: [facility] Referring provider: [facility]   Katherine Padilla returns to the office today for the patch test placement, given suspected history of contact dermatitis. Patch care discussed and all questions answered.    Diagnostics: NAC 80 patches placed NAC-80 (1-80)   1. Ammonium persulfate  2. Fiji Balsam  3. Omitted  4. 4-tert-Butylphenolformaldehyde resin (PTBP)  5. Bacitracin  6. Budesonide  7. Quaternium-15  8. Cinnamal  9. Cobalt(II) chloride hexahydrate  10. Colophonium  11. Methyldibromo glutaronitrile  12. Decyl Glucoside  13. Ethylenediamine dihydrochloride  14. 2-Hydroxyethyl methacrylate  15. Hydroperoxides of Linalool  16. Iodopropynyl butylcarbamate  17. 2-Mercaptobenzothiazole (MBT)  18. Thiuram mix  19. METHYLISOTHIAZOLINONE  20. Propylene glycol  21. 1,3-Diphenylguanidine  22. Hydroperoxides of Limonene  23. Black rubber mix  24. Carba mix  25. Fragrance mix I  26. Fragrance mix II  27. Textile dye mix II  28. Neomycin sulfate  29. Nickel(II) sulfate hexahydrate  30. p-Phenylenediamine (PPD)  31. Potassium dichromate  32. Propolis  33. Sodium Metabisulfite  34. Tixocortol-21-pivalate  35. Lanolin alcohol  36. Methylisothiazolinone + Methylchloroisothiazolinone  37. Cocamidopropyl betaine  38. 3-(Dimethylamino)-1-propylamine  39. Formaldehyde  40. Oleamidopropyl dimethylamine  41. 2-Bromo-2-Nitropropane-l,3-diol  42. Diazolidinyl urea  43. DMDM Hydantoin  44. Epoxy resin, Bisphenol A  45. Benzophenone-4  46. Imidazolidinyl urea  47. Lauryl polyglucose  48 Methyl methacrylate  49. Paraben mix  50. Mercapto mix  51. Caine mix III  52. Mixed dialkyl thiourea  53. Compositae mix II  54. Toluenesulfonamide formaldehyde resin  55. Tea Tree Oil oxidized  56. Ylang-Ylang oil  57. Amidoamine  58. Amerchol  L 101  59. Benzocaine  60. Benzyl alchohol  61. Benzyl salicylate  62. Chloroxylenol (PCMX)  63. Cocamide DEA  64. Clobetasol-17-propionate  65. Toluene-2,5-Diamine sulfate  66. Ethyl acrylate  67. N-Isopropyl-N-phenyl--4-phenylenediamine (IPPD)  68. Lidocaine   69. Omitted  70. Sesquiterpene lactone mix  71. 2-n-Octyl-4-isothiazolin-3-one  72. Propyl gallate  73. Polymyxin B sulfate  74. Pramoxine hydrochloride  75. Sodium benzoate  76. Sorbitan oleate  77. Sorbitan sesquioleate  78. Tocopherol  79. BENZALKONIUM CHLORIDE  80. Chlorhexidine digluconate    Allergic contact dermatitis - Instructions provided on care of the patches for the next 48 hours. - Katherine Padilla was instructed to avoid showering for the next 48 hours. - Katherine Padilla will follow up in 48 hours and 96 hours for patch readings.   Call the clinic if this treatment plan is not working well for you  Follow up in 2 days or sooner if needed.  Thank you for the opportunity to care for this patient.  Please do not hesitate to contact me with questions.  Arlean Mutter, FNP [facility]  [facility]

## 2024-08-01 NOTE — Patient Instructions (Signed)
 Diagnostics: NAC 80 patches placed NAC-80 (1-80)   1. Ammonium persulfate  2. Fiji Balsam  3. Omitted  4. 4-tert-Butylphenolformaldehyde resin (PTBP)  5. Bacitracin  6. Budesonide  7. Quaternium-15  8. Cinnamal  9. Cobalt(II) chloride hexahydrate  10. Colophonium  11. Methyldibromo glutaronitrile  12. Decyl Glucoside  13. Ethylenediamine dihydrochloride  14. 2-Hydroxyethyl methacrylate  15. Hydroperoxides of Linalool  16. Iodopropynyl butylcarbamate  17. 2-Mercaptobenzothiazole (MBT)  18. Thiuram mix  19. METHYLISOTHIAZOLINONE  20. Propylene glycol  21. 1,3-Diphenylguanidine  22. Hydroperoxides of Limonene  23. Black rubber mix  24. Carba mix  25. Fragrance mix I  26. Fragrance mix II  27. Textile dye mix II  28. Neomycin sulfate  29. Nickel(II) sulfate hexahydrate  30. p-Phenylenediamine (PPD)  31. Potassium dichromate  32. Propolis  33. Sodium Metabisulfite  34. Tixocortol-21-pivalate  35. Lanolin alcohol  36. Methylisothiazolinone + Methylchloroisothiazolinone  37. Cocamidopropyl betaine  38. 3-(Dimethylamino)-1-propylamine  39. Formaldehyde  40. Oleamidopropyl dimethylamine  41. 2-Bromo-2-Nitropropane-l,3-diol  42. Diazolidinyl urea  43. DMDM Hydantoin  44. Epoxy resin, Bisphenol A  45. Benzophenone-4  46. Imidazolidinyl urea  47. Lauryl polyglucose  48 Methyl methacrylate  49. Paraben mix  50. Mercapto mix  51. Caine mix III  52. Mixed dialkyl thiourea  53. Compositae mix II  54. Toluenesulfonamide formaldehyde resin  55. Tea Tree Oil oxidized  56. Ylang-Ylang oil  57. Amidoamine  58. Amerchol L 101  59. Benzocaine  60. Benzyl alchohol  61. Benzyl salicylate  62. Chloroxylenol (PCMX)  63. Cocamide DEA  64. Clobetasol-17-propionate  65. Toluene-2,5-Diamine sulfate  66. Ethyl acrylate  67. N-Isopropyl-N-phenyl--4-phenylenediamine (IPPD)  68. Lidocaine   69. Omitted  70. Sesquiterpene lactone mix  71. 2-n-Octyl-4-isothiazolin-3-one  72. Propyl  gallate  73. Polymyxin B sulfate  74. Pramoxine hydrochloride  75. Sodium benzoate  76. Sorbitan oleate  77. Sorbitan sesquioleate  78. Tocopherol  79. BENZALKONIUM CHLORIDE  80. Chlorhexidine digluconate    Allergic contact dermatitis - Instructions provided on care of the patches for the next 48 hours. - Katherine Padilla was instructed to avoid showering for the next 48 hours. - Katherine Padilla will follow up in 48 hours and 96 hours for patch readings.   Call the clinic if this treatment plan is not working well for you  Follow up in 2 days or sooner if needed.

## 2024-08-03 ENCOUNTER — Ambulatory Visit (INDEPENDENT_AMBULATORY_CARE_PROVIDER_SITE_OTHER): Admitting: Internal Medicine

## 2024-08-03 DIAGNOSIS — L2389 Allergic contact dermatitis due to other agents: Secondary | ICD-10-CM

## 2024-08-03 NOTE — Progress Notes (Signed)
 Follow Up Note  RE: Katherine Padilla MRN: [id number] DOB: Jun 14, 1991 Date of Office Visit: 08/03/2024  Referring provider: [facility] Primary care provider: [facility]  History of Present Illness: I had the pleasure of seeing Katherine Padilla for a follow up visit at the [facility] on 08/03/2024. She is a 33 y.o. female, who is being followed for atopic dermatitis with concern for contact dermatitis overlap, allergic rhinoconjunctivitis, largygospam/asthma overlap. Today she is here for initial patch test interpretation, given suspected history of contact dermatitis.   Diagnostics:   NAC-80 TEST 48 hour reading:   NAC-80 - 08/03/24 1038     NAC Reading Interval Day 3    NAC Panel Tested NAC-80 (1-80)    1. Ammonium persulfate Negative    2. Fiji Balsam Negative    3. BENZISOTHIAZOLINONE Negative    4. 4-tert-Butylphenolformaldehyde resin (PTBP) Negative    5. Bacitracin Negative    6. Budesonide Negative    7. Quaternium-15 Negative    8. Cinnamal Negative    9. Cobalt(II) chloride hexahydrate Negative    10. Colophonium Negative    11. Methyldibromo glutaronitrile Negative    12. Decyl Glucoside Negative    13. Ethylenediamine dihydrochloride Negative    14. 2-Hydroxyethyl methacrylate Negative    15. Hydroperoxides of Linalool Negative    16. Iodopropynyl butylcarbamate Negative    17. 2-Mercaptobenzothiazole (MBT) Negative    18. Thiuram mix Negative    19. METHYLISOTHIAZOLINONE Negative    20. Propylene glycol Negative    21. 1,3-Diphenylguanidine Negative    22. Hydroperoxides of Limonene Negative    23. Black rubber mix Negative    24. Carba mix Negative    25. Fragrance mix I Negative    26. Fragrance mix II Negative    27. Textile dye mix II Negative    28. Neomycin sulfate Negative    29. Nickel(II) sulfate hexahydrate Negative    30. p-Phenylenediamine (PPD) Negative    31. Potassium dichromate Negative    32. Propolis  Negative    33. Sodium Metabisulfite Negative    34. Tixocortol-21-pivalate Negative    35. Lanolin alcohol Negative    36. Methylisothiazolinone + Methylchloroisothiazolinone Negative    37. Cocamidopropyl betaine Negative    38. 3-(Dimethylamino)-1-propylamine Negative    39. Formaldehyde Negative    40. Oleamidopropyl dimethylamine Negative    41. 2-Bromo-2-Nitropropane-l,3-diol Negative    42. Diazolidinyl urea Negative    43. DMDM Hydantoin Negative    44. Epoxy resin, Bisphenol A Negative    45. Benzophenone-4 Negative    46. Imidazolidinyl urea Negative    47. Lauryl polyglucose Negative    48 Methyl methacrylate Negative    49. Paraben mix Negative    50. Mercapto mix Negative    51. Caine mix III Negative    52. Mixed dialkyl thiourea Negative    53. Compositae mix II Negative    54. Toluenesulfonamide formaldehyde resin Negative    55. Tea Tree Oil oxidized Negative    56. Ylang-Ylang oil Negative    57. Amidoamine Negative    58. Amerchol L 101 Negative    59. Benzocaine Negative    60. Benzyl alchohol Negative    61. Benzyl salicylate Negative    62. Chloroxylenol (PCMX) Negative    63. Cocamide DEA Negative    64. Clobetasol-17-propionate Negative    65. Toluene-2,5-Diamine sulfate Negative    66. Ethyl acrylate Negative    67. N-Isopropyl-N-phenyl--4-phenylenediamine (IPPD)  Negative    68. Lidocaine  Negative    69. Hydroxyisohexyl 3-Cyclohexene Carboxaldehyde Negative    70. Sesquiterpene lactone mix Negative    71. 2-n-Octyl-4-isothiazolin-3-one Negative    72. Propyl gallate Negative    73. Polymyxin B sulfate Negative    74. Pramoxine hydrochloride Negative    75. Sodium benzoate Negative    76. Sorbitan oleate Negative    77. Sorbitan sesquioleate Negative    78. Tocopherol Negative    79. BENZALKONIUM CHLORIDE Negative    80. Chlorhexidine digluconate Negative           Assessment and Plan: Katherine Padilla is a 33 y.o. female with: Concern for  Contact Dermatitis:  Discussed with patient that patch testing tests for contact dermatitis and sometimes it does not correlate to how one will react to allergens in the body. Positive patch testing results can help in avoiding those items however it is possible to get false negative results.  Nevertheless, this is the most accessible test for contact dermatitis currently available. There are other more extensive patches Okay to take antihistamines for itching but avoid placing any creams on the back where the patches are. Return on Friday for final read  It was my pleasure to see Katherine Padilla today and participate in her care. Please feel free to contact me with any questions or concerns.  Sincerely,   Rocky Endow, MD [facility]

## 2024-08-05 ENCOUNTER — Ambulatory Visit (INDEPENDENT_AMBULATORY_CARE_PROVIDER_SITE_OTHER): Admitting: Internal Medicine

## 2024-08-05 ENCOUNTER — Encounter: Payer: Self-pay | Admitting: Internal Medicine

## 2024-08-05 DIAGNOSIS — L2389 Allergic contact dermatitis due to other agents: Secondary | ICD-10-CM | POA: Diagnosis not present

## 2024-08-05 NOTE — Progress Notes (Signed)
 Follow Up Note  RE: Nyiesha Beever MRN: [id number] DOB: [birth year] Date of Office Visit: 08/05/2024  Referring provider: [facility] Primary care provider: [facility]  History of Present Illness: I had the pleasure of seeing [facility] for a follow up visit at the [facility] on 08/05/2024. She is a 33 y.o. female, who is being followed for allergic rhinoconjunctivitis, asthma, VCD, atopic dematitis of hand and eyelids. Today she is here for final patch test interpretation, given suspected history of contact dermatitis.   Diagnostics:   NAC-80 TEST 96 hour reading:   NAC-80 - 08/05/24 1000     NAC Reading Interval Day 5    NAC Panel Tested NAC-80 (1-80)    1. Ammonium persulfate Negative    2. [place] Balsam Negative    3. BENZISOTHIAZOLINONE Negative    4. 4-tert-Butylphenolformaldehyde resin (PTBP) Negative    5. Bacitracin Negative    6. Budesonide Negative    7. Quaternium-15 Negative    8. Cinnamal Negative    9. Cobalt(II) chloride hexahydrate Negative    10. Colophonium Negative    11. Methyldibromo glutaronitrile Negative    12. Decyl Glucoside Negative    13. Ethylenediamine dihydrochloride Negative    14. 2-Hydroxyethyl methacrylate Negative    15. Hydroperoxides of Linalool Negative    16. Iodopropynyl butylcarbamate Negative    17. 2-Mercaptobenzothiazole (MBT) Negative    18. Thiuram mix Negative    19. METHYLISOTHIAZOLINONE Negative    20. Propylene glycol Negative    21. 1,3-Diphenylguanidine Negative    22. Hydroperoxides of Limonene Negative    23. Black rubber mix Negative    24. Carba mix Negative    25. Fragrance mix I Negative    26. Fragrance mix II Negative    27. Textile dye mix II Negative    28. Neomycin sulfate Negative    29. Nickel(II) sulfate hexahydrate Negative    30. p-Phenylenediamine (PPD) Negative    31. Potassium dichromate Negative    32. Propolis Negative    33. Sodium Metabisulfite  Negative    34. Tixocortol-21-pivalate Negative    35. Lanolin alcohol Negative    36. Methylisothiazolinone + Methylchloroisothiazolinone Negative    37. Cocamidopropyl betaine Negative    38. 3-(Dimethylamino)-1-propylamine Negative    39. Formaldehyde Negative    40. Oleamidopropyl dimethylamine Negative    41. 2-Bromo-2-Nitropropane-l,3-diol Negative    42. Diazolidinyl urea Negative    43. DMDM Hydantoin Negative    44. Epoxy resin, Bisphenol A Negative    45. Benzophenone-4 Negative    46. Imidazolidinyl urea Negative    47. Lauryl polyglucose Negative    48 Methyl methacrylate Negative    49. Paraben mix Negative    50. Mercapto mix Negative    51. Caine mix III Negative    52. Mixed dialkyl thiourea Negative    53. Compositae mix II Negative    54. Toluenesulfonamide formaldehyde resin Negative    55. Tea Tree Oil oxidized Negative    56. Ylang-Ylang oil Negative    57. Amidoamine Negative    58. Amerchol L 101 Negative    59. Benzocaine Negative    60. Benzyl alchohol Negative    61. Benzyl salicylate Negative    62. Chloroxylenol (PCMX) Negative    63. Cocamide DEA Negative    64. Clobetasol-17-propionate Negative    65. Toluene-2,5-Diamine sulfate Negative    66. Ethyl acrylate Negative    67. N-Isopropyl-N-phenyl--4-phenylenediamine (IPPD) Negative  68. Lidocaine  Negative    69. Hydroxyisohexyl 3-Cyclohexene Carboxaldehyde Negative    70. Sesquiterpene lactone mix Negative    71. 2-n-Octyl-4-isothiazolin-3-one Negative    72. Propyl gallate Negative    73. Polymyxin B sulfate Negative    74. Pramoxine hydrochloride Negative    75. Sodium benzoate Negative    76. Sorbitan oleate Negative    77. Sorbitan sesquioleate Negative    78. Tocopherol Negative    79. BENZALKONIUM CHLORIDE Negative    80. Chlorhexidine digluconate Negative           Assessment and Plan: Arlo is a 33 y.o. female with: Concern for Contact Dermatitis:  Discussed with  patient that patch testing tests for contact dermatitis and sometimes it does not correlate to how one will react to allergens in the body. Positive patch testing results can help in avoiding those items however it is possible to get false negative results.  Nevertheless, this is the most accessible test for contact dermatitis currently available. There are other more extensive patches Testing today was negative.  Discussed continued skin regimen as discussed at prior visit.   Follow up : 3 months, sooner if needed It was a pleasure seeing you again in clinic today! Thank you for allowing me to participate in your care.  Rocky Endow, MD [facility]   It was my pleasure to see Niang today and participate in her care. Please feel free to contact me with any questions or concerns.  Sincerely,   Rocky Endow, MD [facility]

## 2024-08-05 NOTE — Patient Instructions (Signed)
 Allergic rhinitis and conjunctivitis Chronic allergic rhinitis unresponsive to oral antihistamines or nasal corticosteroids. Consideration of environmental allergies and potential triggers. - skin testing 07/22/24: positive to cockroach and dust mites; intradermals positive to indoor and outdoor molds, cat and dog. allergen avoidance.  -Consider allergy  injections to reduce lifetime symptoms and need for medications by teaching your immune system to become tolerant of the environmental allergens you are allergic to - Xyzal  (levocetirizine) 5 mg daily as needed  - Dymista  nasal spray - 1 spray twice daily as needed  - Prescribe eye drops for itchy eyes. 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 - Educate on vocal cord exercises to alleviate symptoms. See below. - Monitor response to exercises and Airsupra  inhaler to differentiate between asthma and laryngospasm.  Asthma Asthma with chest tightness and wheezing, exacerbated by colds and environmental triggers. Limited relief from albuterol  inhaler. Suspected laryngospasm contributing to symptoms. - Prescribe Airsupra  inhaler for use as needed and during colds. - Instruct to use Airsupra  two puffs twice daily during colds, up to 12 puffs per day if needed. - Educate on vocal cord exercises to manage suspected laryngospasms. For immediate onset symptoms or symptoms brought on by sneezing or smells, try vocal cord exercises first, and if no response after a few minutes, use airsupra .   Eczema-eyelids and hands as well as upper chest Chronic eczema with dark patches and itching, worsened by dry skin. No previous use of topical steroids. - Prescribe triamcinolone  cream for body rashes, avoiding face, armpits, and groin. Use twice daily as needed on body. Do not use on face, groin or arm pits. -  Prescribe Eucrisa  for facial eczema, especially around the eyelids. Use twice daily as needed during flares. - Refer to dermatologist for further management of facial hyperpigmentation. - Advise on use of hypoallergenic moisturizers without fragrances or dyes. -key to eczema is keeping skin hydrated multiple times daily to help prevent flares - use Sunblock (black girl Sunblock) every day to help prevent further pigmentation issues and prevent sun damage Patch testing with NAC80 completed 08/05/24-negative to entire panel   Follow up : 3 months sooner if needed It was a pleasure meeting you in clinic today! Thank you for allowing me to participate in your care.  Rocky Endow, MD [facility]   Vocal Cord Dysfunction (VCD) /Inspiratory Laryngeal Obstruction (ILO)/ Exercise- Induced Laryngeal Obstruction Exercises (EILO)  Practice these techniques several times a day, so that when you need them, they will be automatic, and will work right away (or very fast) to open up the spasming (closed) vocal cords.  PREPARATION: ? Loosen clothing at waist, so nothing is tight or snug. Open top buttons of pants or skirt, unzip pant or skirt zippers, pull shirt out over pants or skirt. This prevents pressure on the stomach, which can cause stomach reflux (backup of corrosive liquid) into the esophagus. Gastric reflux is a frequent cause of VCD attacks. ? Sit in a comfortable chair. When you need to use these techniques, you may be standing, lying, or sitting-BUT first try to learn them while sitting because they are easier to learn in this position. ? Keep water handy. Take sips, so your mouth and throat will not dry out. Swallow carefully, to avoid choking. ? Put your right (or left) thumb on your belly button, with the rest of your fingers  below the thumb, so you are GENTLY touching your belly (lower abdomen). ? Doing this will focus your attention on your lower abdominal  muscles. ? Relax your chest. Relax your shoulders. Relax your neck. Relax your throat. This helps you to try to use ONLY your belly (lower abdomen) muscles. Consciously try NOT to use chest muscles, etc. Chest muscle use seems to irritate vocal cords while "belly" muscles tend to relax them.  BREATHE OUT (EXHALE) FIRST WITH slightly PURSED LIPS: ? Since most people cannot inhale, (breathe in), during VCD attacks, please start by breathing out (exhaling) in the special way described below, and this will usually open up the vocal cords, immediately. ? Start, by breathing out (exhaling) with lips "pursed" as if you are trying to gently blow out a candle. ? This is similar to whistling, but your lips will not be as "puckered' as when whistling and your lips will not be pushed forward, like when whistling. If you look in a mirror, you would see a mostly horizontal line of space between the lips, while you exhale the 'ffffffffffffffffffff'. ? This may be silent, or may sound like a gentle wind or breeze, like 'fffffffffffffff' and your lips should be symmetrical, around your teeth. You lower lip will not be touching your upper teeth, like when someone says the name [id number]. This is one continuous flow of exhaled air, either silent, or making a slightly windy sound. ? Feel your hand on your belly (lower abdomen) come IN, a little bit toward your back, as you are 'working' only your lower belly muscles. ? This abdominal exhaled 'fffffffffffffffff' alone often stops VCD attacks very quickly.  ? Some prefer to try a variation of exhaling 'ffffffffffff'. Try exhaling quickly, 'ffff', 'fffff', 'ffff'--all part of one exhalation. Do not inhale in between quick "fff's. This helps some people more quickly open up the spasming vocal cords. This is like blowing out a slightly stubborn candle, exhaling against a tiny bit of resistance (like trying to blow out a trick birthday candle). ? If any of this helps,  try to slow down the exhalations, and gently exhale 'ffffffffffffffffff'. ? Some people prefer to exhale gently 'ssssssssssssssss' or 'shhhhhhhhhhhhh' rather than 'fffffffffffffff', etc. Choose what works best in your case, or what is most comfortable for you. ? You may need to repeat exhaling 'ffffffffffffff' (or 'ffff', 'ffff', 'ffff'), etc, several times to relax the spasming vocal cords. Repeating this often stops a VCD attack so that you can inhale (breathe in) again.

## 2024-08-09 ENCOUNTER — Telehealth: Payer: Self-pay | Admitting: Internal Medicine

## 2024-08-09 ENCOUNTER — Ambulatory Visit: Admitting: Internal Medicine

## 2024-08-09 NOTE — Telephone Encounter (Signed)
 Katherine Padilla is scheduled with Dermatology for 3/12 at 8:30 am with Dr. Alm

## 2024-08-12 NOTE — Addendum Note (Signed)
 Addended by: WILLIAMS, Quinnetta Roepke H on: 08/12/2024 11:55 AM   Modules accepted: Orders

## 2025-03-02 ENCOUNTER — Ambulatory Visit: Admitting: Dermatology
